# Patient Record
Sex: Female | Born: 1960
Health system: Southern US, Community
[De-identification: ages and names within clinical notes are randomized; demographics above are authoritative.]

## PROBLEM LIST (undated history)

## (undated) DIAGNOSIS — F419 Anxiety disorder, unspecified: Secondary | ICD-10-CM

## (undated) DIAGNOSIS — I5189 Other ill-defined heart diseases: Secondary | ICD-10-CM

## (undated) DIAGNOSIS — R079 Chest pain, unspecified: Secondary | ICD-10-CM

## (undated) DIAGNOSIS — E7800 Pure hypercholesterolemia, unspecified: Secondary | ICD-10-CM

## (undated) DIAGNOSIS — E669 Obesity, unspecified: Secondary | ICD-10-CM

## (undated) DIAGNOSIS — J189 Pneumonia, unspecified organism: Secondary | ICD-10-CM

## (undated) DIAGNOSIS — K219 Gastro-esophageal reflux disease without esophagitis: Secondary | ICD-10-CM

## (undated) DIAGNOSIS — I10 Essential (primary) hypertension: Secondary | ICD-10-CM

## (undated) DIAGNOSIS — M1990 Unspecified osteoarthritis, unspecified site: Secondary | ICD-10-CM

## (undated) DIAGNOSIS — F410 Panic disorder [episodic paroxysmal anxiety] without agoraphobia: Secondary | ICD-10-CM

## (undated) DIAGNOSIS — R87629 Unspecified abnormal cytological findings in specimens from vagina: Secondary | ICD-10-CM

## (undated) DIAGNOSIS — F1410 Cocaine abuse, uncomplicated: Secondary | ICD-10-CM

## (undated) DIAGNOSIS — R7303 Prediabetes: Secondary | ICD-10-CM

## (undated) HISTORY — DX: Obesity, unspecified: E66.9

## (undated) HISTORY — DX: Anxiety disorder, unspecified: F41.9

## (undated) HISTORY — PX: CORONARY ANGIOPLASTY WITH STENT PLACEMENT: SHX49

## (undated) HISTORY — DX: Gastro-esophageal reflux disease without esophagitis: K21.9

## (undated) HISTORY — DX: Unspecified osteoarthritis, unspecified site: M19.90

## (undated) HISTORY — DX: Chest pain, unspecified: R07.9

## (undated) HISTORY — DX: Unspecified abnormal cytological findings in specimens from vagina: R87.629

## (undated) HISTORY — DX: Prediabetes: R73.03

## (undated) HISTORY — DX: Cocaine abuse, uncomplicated: F14.10

## (undated) HISTORY — DX: Other ill-defined heart diseases: I51.89

## (undated) HISTORY — DX: Panic disorder (episodic paroxysmal anxiety): F41.0

---

## 1998-06-05 ENCOUNTER — Ambulatory Visit (HOSPITAL_COMMUNITY): Admission: RE | Admit: 1998-06-05 | Discharge: 1998-06-05 | Payer: Self-pay | Admitting: Family Medicine

## 1998-06-05 ENCOUNTER — Encounter: Payer: Self-pay | Admitting: Family Medicine

## 1999-01-18 ENCOUNTER — Emergency Department (HOSPITAL_COMMUNITY): Admission: EM | Admit: 1999-01-18 | Discharge: 1999-01-18 | Payer: Self-pay | Admitting: Emergency Medicine

## 1999-06-04 ENCOUNTER — Emergency Department (HOSPITAL_COMMUNITY): Admission: EM | Admit: 1999-06-04 | Discharge: 1999-06-04 | Payer: Self-pay | Admitting: *Deleted

## 2000-01-04 ENCOUNTER — Other Ambulatory Visit: Admission: RE | Admit: 2000-01-04 | Discharge: 2000-01-04 | Payer: Self-pay | Admitting: Obstetrics

## 2000-01-21 ENCOUNTER — Encounter: Payer: Self-pay | Admitting: Obstetrics

## 2000-01-21 ENCOUNTER — Encounter: Admission: RE | Admit: 2000-01-21 | Discharge: 2000-01-21 | Payer: Self-pay | Admitting: Obstetrics

## 2001-02-07 ENCOUNTER — Encounter: Payer: Self-pay | Admitting: Obstetrics

## 2001-02-07 ENCOUNTER — Encounter: Admission: RE | Admit: 2001-02-07 | Discharge: 2001-02-07 | Payer: Self-pay | Admitting: Obstetrics

## 2001-03-19 ENCOUNTER — Emergency Department (HOSPITAL_COMMUNITY): Admission: EM | Admit: 2001-03-19 | Discharge: 2001-03-19 | Payer: Self-pay

## 2001-04-21 ENCOUNTER — Encounter: Payer: Self-pay | Admitting: Family Medicine

## 2001-04-21 ENCOUNTER — Encounter: Admission: RE | Admit: 2001-04-21 | Discharge: 2001-04-21 | Payer: Self-pay | Admitting: Family Medicine

## 2002-06-28 ENCOUNTER — Ambulatory Visit (HOSPITAL_COMMUNITY): Admission: RE | Admit: 2002-06-28 | Discharge: 2002-06-28 | Payer: Self-pay | Admitting: Obstetrics

## 2002-06-28 ENCOUNTER — Encounter (INDEPENDENT_AMBULATORY_CARE_PROVIDER_SITE_OTHER): Payer: Self-pay

## 2003-08-07 ENCOUNTER — Ambulatory Visit (HOSPITAL_COMMUNITY): Admission: RE | Admit: 2003-08-07 | Discharge: 2003-08-07 | Payer: Self-pay | Admitting: Obstetrics

## 2005-09-01 ENCOUNTER — Emergency Department (HOSPITAL_COMMUNITY): Admission: EM | Admit: 2005-09-01 | Discharge: 2005-09-01 | Payer: Self-pay | Admitting: Emergency Medicine

## 2005-12-14 ENCOUNTER — Emergency Department (HOSPITAL_COMMUNITY): Admission: EM | Admit: 2005-12-14 | Discharge: 2005-12-14 | Payer: Self-pay | Admitting: *Deleted

## 2007-05-19 ENCOUNTER — Ambulatory Visit (HOSPITAL_COMMUNITY): Admission: RE | Admit: 2007-05-19 | Discharge: 2007-05-19 | Payer: Self-pay | Admitting: Obstetrics

## 2007-06-01 ENCOUNTER — Encounter: Admission: RE | Admit: 2007-06-01 | Discharge: 2007-06-01 | Payer: Self-pay | Admitting: Obstetrics

## 2007-10-30 ENCOUNTER — Emergency Department (HOSPITAL_COMMUNITY): Admission: EM | Admit: 2007-10-30 | Discharge: 2007-10-30 | Payer: Self-pay | Admitting: Emergency Medicine

## 2008-07-02 ENCOUNTER — Encounter: Admission: RE | Admit: 2008-07-02 | Discharge: 2008-07-02 | Payer: Self-pay | Admitting: Obstetrics

## 2008-08-09 ENCOUNTER — Ambulatory Visit (HOSPITAL_COMMUNITY): Admission: RE | Admit: 2008-08-09 | Discharge: 2008-08-09 | Payer: Self-pay | Admitting: Obstetrics

## 2008-09-03 ENCOUNTER — Emergency Department (HOSPITAL_COMMUNITY): Admission: EM | Admit: 2008-09-03 | Discharge: 2008-09-03 | Payer: Self-pay | Admitting: Emergency Medicine

## 2009-01-30 ENCOUNTER — Emergency Department (HOSPITAL_COMMUNITY): Admission: EM | Admit: 2009-01-30 | Discharge: 2009-01-30 | Payer: Self-pay | Admitting: Emergency Medicine

## 2009-06-19 ENCOUNTER — Emergency Department (HOSPITAL_COMMUNITY): Admission: EM | Admit: 2009-06-19 | Discharge: 2009-06-19 | Payer: Self-pay | Admitting: Emergency Medicine

## 2009-07-29 ENCOUNTER — Encounter: Admission: RE | Admit: 2009-07-29 | Discharge: 2009-07-29 | Payer: Self-pay | Admitting: Obstetrics

## 2009-09-12 ENCOUNTER — Emergency Department (HOSPITAL_COMMUNITY): Admission: EM | Admit: 2009-09-12 | Discharge: 2009-09-13 | Payer: Self-pay | Admitting: Emergency Medicine

## 2009-10-14 ENCOUNTER — Emergency Department (HOSPITAL_COMMUNITY): Admission: EM | Admit: 2009-10-14 | Discharge: 2009-10-14 | Payer: Self-pay | Admitting: Emergency Medicine

## 2010-03-24 ENCOUNTER — Emergency Department (HOSPITAL_COMMUNITY): Admission: EM | Admit: 2010-03-24 | Discharge: 2010-03-24 | Payer: Self-pay | Admitting: Family Medicine

## 2010-04-21 ENCOUNTER — Emergency Department (HOSPITAL_COMMUNITY): Admission: EM | Admit: 2010-04-21 | Discharge: 2010-04-21 | Payer: Self-pay | Admitting: Emergency Medicine

## 2010-10-24 ENCOUNTER — Encounter: Payer: Self-pay | Admitting: Family Medicine

## 2010-10-25 ENCOUNTER — Encounter: Payer: Self-pay | Admitting: Obstetrics

## 2010-12-20 LAB — POCT I-STAT, CHEM 8
Calcium, Ion: 1.13 mmol/L (ref 1.12–1.32)
Chloride: 102 mEq/L (ref 96–112)
Creatinine, Ser: 0.9 mg/dL (ref 0.4–1.2)
Hemoglobin: 14.3 g/dL (ref 12.0–15.0)
TCO2: 31 mmol/L (ref 0–100)

## 2011-01-13 LAB — DIFFERENTIAL
Basophils Absolute: 0 10*3/uL (ref 0.0–0.1)
Eosinophils Relative: 2 % (ref 0–5)
Lymphocytes Relative: 27 % (ref 12–46)
Lymphs Abs: 1.8 10*3/uL (ref 0.7–4.0)
Monocytes Absolute: 0.5 10*3/uL (ref 0.1–1.0)
Neutro Abs: 4.3 10*3/uL (ref 1.7–7.7)

## 2011-01-13 LAB — BASIC METABOLIC PANEL
BUN: 10 mg/dL (ref 6–23)
CO2: 31 mEq/L (ref 19–32)
Chloride: 107 mEq/L (ref 96–112)
Creatinine, Ser: 0.9 mg/dL (ref 0.4–1.2)
Glucose, Bld: 93 mg/dL (ref 70–99)
Potassium: 3.4 mEq/L — ABNORMAL LOW (ref 3.5–5.1)
Sodium: 142 mEq/L (ref 135–145)

## 2011-01-13 LAB — CBC
HCT: 38.7 % (ref 36.0–46.0)
Hemoglobin: 13.1 g/dL (ref 12.0–15.0)
MCHC: 33.8 g/dL (ref 30.0–36.0)
RDW: 12.7 % (ref 11.5–15.5)
WBC: 6.8 10*3/uL (ref 4.0–10.5)

## 2011-01-13 LAB — POCT CARDIAC MARKERS
CKMB, poc: <1 ng/mL — ABNORMAL LOW (ref 1.0–8.0)
Troponin i, poc: <0.05 ng/mL (ref 0.00–0.09)

## 2011-02-19 NOTE — Op Note (Signed)
   NAME:  ZERA, MARKWARDT                      ACCOUNT NO.:  [id number]   MEDICAL RECORD NO.:  [id number]                   PATIENT TYPE:  AMB   LOCATION:  SDC                                  FACILITY:  [facility]   PHYSICIAN:  Kathreen Cosier, M.D.           DATE OF BIRTH:  1961/08/28   DATE OF PROCEDURE:  06/28/2002  DATE OF DISCHARGE:                                 OPERATIVE REPORT   PREOPERATIVE DIAGNOSES:  Dysfunctional uterine bleeding.   PROCEDURE:  Diagnostic dilatation and curettage.  Using MAC patient in  lithotomy position.  Perineum and vagina prepped and draped.  Bladder  emptied with straight catheter.  Bimanual examination revealed the uterus to  be symmetrically enlarged.  Negative adnexa.  Weighted speculum placed in  the vagina.  Cervix injected with 8 cc of 1% Xylocaine at 3, 9, and 12  o'clock.  Anterior lip of the cervix grasped with a tenaculum.  The  endocervix was curetted, small amount of tissue obtained.  Endometrial  cavity sounded 10 cm.  The cavity was posterior.  The cervix was dilated  number 29 Pratt and then sharp curettage done until the cavity clean.  Moderate amount of tissue obtained.  There were no myomas felt within the  cavity.  Polyp forceps inserted.  No polyps obtained.  The patient tolerated  procedure well.  Was taken to recovery room in good condition.                                               Kathreen Cosier, M.D.    BAM/MEDQ  D:  06/28/2002  T:  06/28/2002  Job:  [id number]

## 2011-04-22 ENCOUNTER — Emergency Department (HOSPITAL_COMMUNITY)
Admission: EM | Admit: 2011-04-22 | Discharge: 2011-04-22 | Disposition: A | Discharge status: Home / Self Care | Payer: Self-pay | Attending: Emergency Medicine | Admitting: Emergency Medicine

## 2011-04-22 DIAGNOSIS — I10 Essential (primary) hypertension: Secondary | ICD-10-CM | POA: Insufficient documentation

## 2011-04-22 DIAGNOSIS — R51 Headache: Secondary | ICD-10-CM | POA: Insufficient documentation

## 2011-07-09 LAB — POCT I-STAT, CHEM 8
BUN: 11 mg/dL (ref 6–23)
Calcium, Ion: 1.21 mmol/L (ref 1.12–1.32)
Creatinine, Ser: 0.9 mg/dL (ref 0.4–1.2)
Glucose, Bld: 94 mg/dL (ref 70–99)
Sodium: 143 mEq/L (ref 135–145)

## 2011-07-09 LAB — POCT CARDIAC MARKERS

## 2011-08-06 ENCOUNTER — Emergency Department (HOSPITAL_COMMUNITY)
Admission: EM | Admit: 2011-08-06 | Discharge: 2011-08-06 | Disposition: A | Discharge status: Home / Self Care | Payer: No Typology Code available for payment source | Attending: Emergency Medicine | Admitting: Emergency Medicine

## 2011-08-06 DIAGNOSIS — I10 Essential (primary) hypertension: Secondary | ICD-10-CM | POA: Insufficient documentation

## 2011-08-06 DIAGNOSIS — T1490XA Injury, unspecified, initial encounter: Secondary | ICD-10-CM | POA: Insufficient documentation

## 2011-08-06 DIAGNOSIS — R209 Unspecified disturbances of skin sensation: Secondary | ICD-10-CM | POA: Insufficient documentation

## 2011-08-06 DIAGNOSIS — Y92410 Unspecified street and highway as the place of occurrence of the external cause: Secondary | ICD-10-CM | POA: Insufficient documentation

## 2011-12-03 ENCOUNTER — Other Ambulatory Visit: Payer: Self-pay | Admitting: Family Medicine

## 2011-12-03 DIAGNOSIS — Z1231 Encounter for screening mammogram for malignant neoplasm of breast: Secondary | ICD-10-CM

## 2011-12-15 ENCOUNTER — Ambulatory Visit
Admission: RE | Admit: 2011-12-15 | Discharge: 2011-12-15 | Disposition: A | Discharge status: Home / Self Care | Payer: No Typology Code available for payment source | Source: Ambulatory Visit | Attending: Family Medicine | Admitting: Family Medicine

## 2011-12-15 DIAGNOSIS — Z1231 Encounter for screening mammogram for malignant neoplasm of breast: Secondary | ICD-10-CM

## 2012-02-16 ENCOUNTER — Emergency Department (HOSPITAL_COMMUNITY)
Admission: EM | Admit: 2012-02-16 | Discharge: 2012-02-16 | Discharge status: Against Medical Advice | Payer: Medicaid Other | Attending: Emergency Medicine | Admitting: Emergency Medicine

## 2012-02-16 ENCOUNTER — Encounter (HOSPITAL_COMMUNITY): Payer: Self-pay

## 2012-02-16 DIAGNOSIS — R51 Headache: Secondary | ICD-10-CM | POA: Insufficient documentation

## 2012-02-16 HISTORY — DX: Essential (primary) hypertension: I10

## 2012-02-16 HISTORY — DX: Pure hypercholesterolemia, unspecified: E78.00

## 2012-02-16 NOTE — ED Notes (Signed)
Pt states that she started having a headache this evening as she was cooking dinner

## 2012-03-16 ENCOUNTER — Emergency Department (HOSPITAL_COMMUNITY)
Admission: EM | Admit: 2012-03-16 | Discharge: 2012-03-16 | Disposition: A | Discharge status: Home / Self Care | Payer: Medicaid Other

## 2012-03-17 ENCOUNTER — Emergency Department (HOSPITAL_COMMUNITY)
Admission: EM | Admit: 2012-03-17 | Discharge: 2012-03-17 | Disposition: A | Discharge status: Home / Self Care | Payer: Medicaid Other | Source: Home / Self Care | Attending: Emergency Medicine | Admitting: Emergency Medicine

## 2012-03-17 ENCOUNTER — Encounter (HOSPITAL_COMMUNITY): Payer: Self-pay | Admitting: *Deleted

## 2012-03-17 DIAGNOSIS — H7290 Unspecified perforation of tympanic membrane, unspecified ear: Secondary | ICD-10-CM

## 2012-03-17 MED ORDER — HYDROCODONE-ACETAMINOPHEN 5-325 MG PO TABS: 2.0000 | ORAL_TABLET | ORAL | Status: AC | PRN

## 2012-03-17 NOTE — ED Notes (Signed)
PT  States   A  Piece  Of  A  q  Tip  Broke    Off in  Her  Ear  yest  She  Has  Pain

## 2012-03-17 NOTE — ED Notes (Signed)
Pt  Reports       She  Was  At  [facility]  Earlier  And  Thet=y  Tried  To  Irrigate  It  Out  But  Could  Not  Get it  Out

## 2012-03-17 NOTE — ED Notes (Signed)
Piece  Of a  Qt  Tip  Broke  Off  In r  Ear  Yesterday     She  Has  Pain

## 2012-03-17 NOTE — Discharge Instructions (Signed)
Do not get your ear wet. If you decide to take a shower, use a wax or putty earplugs. Do not push this down into your ear. He may take 600 mg of ibuprofen and 1 g of Tylenol up to 4 times a day as needed for pain. Use the Norco only for severe pain cannot take the Norco and Tylenol, so that have Tylenol in them, and too much will hurt your liver. Do not exceed 4 g Tylenol from all sources in the day.

## 2012-03-17 NOTE — ED Provider Notes (Signed)
History     CSN: [id number]  Arrival date & time 03/17/12  1752   First MD Initiated Contact with Patient 03/17/12 1806      Chief Complaint  Patient presents with  . Foreign Body in Ear    (Consider location/radiation/quality/duration/timing/severity/associated sxs/prior treatment) HPI Comments: Patient states that she broke the end of a Q-tip off in her right ear while trying clean it yesterday. She went to another urgent care, have her ear irrigated, and they removed cotton and significant amount of wax. However, patient reports decreased hearing, ear pain since having her ear irrigated. She's concerned that there may be something still lodged in her ear. No otorrhea, nausea, vomiting, fevers. Symptoms are better when she tilts her head to the left. No aggravating factors.   ROS as noted in HPI. All other ROS negative.   Patient is a 51 y.o. female presenting with ear pain. The history is provided by the patient. No language interpreter was used.  Otalgia This is a new problem. The current episode started yesterday. There is pain in the right ear. The problem has not changed since onset.There has been no fever. The pain is moderate. Associated symptoms include hearing loss. Pertinent negatives include no ear discharge, no headaches, no vomiting, no neck pain and no rash.    Past Medical History  Diagnosis Date  . Hypertension   . High cholesterol     History reviewed. No pertinent past surgical history.  History reviewed. No pertinent family history.  History  Substance Use Topics  . Smoking status: Current Some Day Smoker  . Smokeless tobacco: Not on file  . Alcohol Use: No    OB History    Grav Para Term Preterm Abortions TAB SAB Ect Mult Living                  Review of Systems  HENT: Positive for hearing loss and ear pain. Negative for neck pain and ear discharge.   Gastrointestinal: Negative for vomiting.  Skin: Negative for rash.  Neurological: Negative  for headaches.    Allergies  Cyclobenzaprine  Home Medications   Current Outpatient Rx  Name Route Sig Dispense Refill  . AMLODIPINE BESYLATE 10 MG PO TABS Oral Take 10 mg by mouth daily.    Marland Kitchen HYDROCODONE-ACETAMINOPHEN 5-325 MG PO TABS Oral Take 2 tablets by mouth every 4 (four) hours as needed for pain. 20 tablet 0  . ADULT MULTIVITAMIN W/MINERALS CH Oral Take 1 tablet by mouth daily.    Marland Kitchen SIMVASTATIN 20 MG PO TABS Oral Take 20 mg by mouth every evening.      BP 184/74  Pulse 66  Temp 98.3 F (36.8 C) (Oral)  Resp 18  SpO2 100%  Physical Exam  Nursing note and vitals reviewed. Constitutional: She is oriented to person, place, and time. She appears well-developed and well-nourished. No distress.  HENT:  Head: Normocephalic and atraumatic.  Right Ear: External ear and ear canal normal. No drainage. Tympanic membrane is perforated. Decreased hearing is noted.  Left Ear: Hearing, tympanic membrane and ear canal normal.  Eyes: Conjunctivae and EOM are normal.  Neck: Normal range of motion.  Cardiovascular: Normal rate.   Pulmonary/Chest: Effort normal.  Abdominal: She exhibits no distension.  Musculoskeletal: Normal range of motion.  Neurological: She is alert and oriented to person, place, and time.  Skin: Skin is warm and dry.  Psychiatric: She has a normal mood and affect. Her behavior is normal. Judgment and thought content  normal.    ED Course  Procedures (including critical care time)  Labs Reviewed - No data to display No results found.   1. Tympanic membrane perforation     MDM  Patient with acute perforation right TM. No foreign bodies visualized. No otorrhea, signs of infection. Patient does have decreased hearing. Referring patient to Dr. Emeline Darling, ENT on call. Advised her to keep her ear dry until she seen by ENT.  Luiz Blare, MD 03/17/12 2123

## 2012-10-26 ENCOUNTER — Emergency Department (INDEPENDENT_AMBULATORY_CARE_PROVIDER_SITE_OTHER)
Admission: EM | Admit: 2012-10-26 | Discharge: 2012-10-26 | Disposition: A | Discharge status: Home / Self Care | Payer: 59 | Source: Home / Self Care | Attending: Family Medicine | Admitting: Family Medicine

## 2012-10-26 ENCOUNTER — Encounter (HOSPITAL_COMMUNITY): Payer: Self-pay | Admitting: *Deleted

## 2012-10-26 DIAGNOSIS — J329 Chronic sinusitis, unspecified: Secondary | ICD-10-CM

## 2012-10-26 DIAGNOSIS — J40 Bronchitis, not specified as acute or chronic: Secondary | ICD-10-CM

## 2012-10-26 MED ORDER — GUAIFENESIN-CODEINE 100-10 MG/5ML PO SYRP: 5.0000 mL | ORAL_SOLUTION | Freq: Three times a day (TID) | ORAL | Status: DC | PRN

## 2012-10-26 MED ORDER — BENZONATATE 100 MG PO CAPS: 100.0000 mg | ORAL_CAPSULE | Freq: Three times a day (TID) | ORAL | Status: DC

## 2012-10-26 MED ORDER — PREDNISONE 20 MG PO TABS: ORAL_TABLET | ORAL | Status: DC

## 2012-10-26 MED ORDER — AZITHROMYCIN 250 MG PO TABS: ORAL_TABLET | ORAL | Status: DC

## 2012-10-26 MED ORDER — ALBUTEROL SULFATE HFA 108 (90 BASE) MCG/ACT IN AERS: 1.0000 | INHALATION_SPRAY | Freq: Four times a day (QID) | RESPIRATORY_TRACT | Status: DC | PRN

## 2012-10-26 NOTE — ED Notes (Signed)
Pt reports cough, nasal congestion, productive cough with yellow sputum for the past two weeks

## 2012-10-26 NOTE — ED Provider Notes (Signed)
History     CSN: [id number]  Arrival date & time 10/26/12  1339   First MD Initiated Contact with Patient 10/26/12 1400      Chief Complaint  Patient presents with  . URI    (Consider location/radiation/quality/duration/timing/severity/associated sxs/prior treatment) HPI Comments: 52 year old smoker female with history of hypertension. Here complaining of nasal congestion, sinus pressure and productive cough with yellow sputum for 2 weeks. Symptoms associated with coughing spells worse at nighttime and wheezing. Denies current shortness of breath or chest pain. Although reports chest discomfort with coughing. No fever or chills. Appetite is okay. Denies abdominal pain nausea vomiting or diarrhea.   Past Medical History  Diagnosis Date  . Hypertension   . High cholesterol     History reviewed. No pertinent past surgical history.  Family History  Problem Relation Age of Onset  . Family history unknown: Yes    History  Substance Use Topics  . Smoking status: Current Some Day Smoker  . Smokeless tobacco: Not on file  . Alcohol Use: No    OB History    Grav Para Term Preterm Abortions TAB SAB Ect Mult Living                  Review of Systems  Constitutional: Negative for fever, chills, diaphoresis, appetite change and fatigue.  HENT: Positive for congestion, rhinorrhea and sinus pressure. Negative for ear pain, sore throat, facial swelling, trouble swallowing and ear discharge.   Eyes: Negative for discharge.  Respiratory: Positive for cough and wheezing.   Cardiovascular: Negative for chest pain and leg swelling.  Gastrointestinal: Negative for nausea, vomiting, abdominal pain and diarrhea.  Skin: Negative for rash.  Neurological: Negative for dizziness and headaches.  All other systems reviewed and are negative.    Allergies  Cyclobenzaprine  Home Medications   Current Outpatient Rx  Name  Route  Sig  Dispense  Refill  . ALBUTEROL SULFATE HFA 108 (90  BASE) MCG/ACT IN AERS   Inhalation   Inhale 1-2 puffs into the lungs every 6 (six) hours as needed for wheezing.   1 Inhaler   0   . AMLODIPINE BESYLATE 10 MG PO TABS   Oral   Take 10 mg by mouth daily.         . AZITHROMYCIN 250 MG PO TABS      2 tabs by mouth on day one then one tablet daily for 4 more days   6 tablet   0   . BENZONATATE 100 MG PO CAPS   Oral   Take 1 capsule (100 mg total) by mouth every 8 (eight) hours.   21 capsule   0   . GUAIFENESIN-CODEINE 100-10 MG/5ML PO SYRP   Oral   Take 5 mLs by mouth 3 (three) times daily as needed for cough.   120 mL   0   . ADULT MULTIVITAMIN W/MINERALS CH   Oral   Take 1 tablet by mouth daily.         Marland Kitchen PREDNISONE 20 MG PO TABS      2 tabs by mouth daily for 5 days   10 tablet   0   . SIMVASTATIN 20 MG PO TABS   Oral   Take 20 mg by mouth every evening.           BP 152/83  Pulse 77  Temp 98.4 F (36.9 C) (Oral)  Resp 16  SpO2 100%  Physical Exam  Nursing note and vitals reviewed.  Constitutional: She is oriented to person, place, and time. She appears well-developed and well-nourished. No distress.  HENT:  Head: Normocephalic and atraumatic.       Nasal Congestion with erythema and severe swelling of nasal turbinates, thick yellow rhinorrhea. Tenderness to palpation over maxillary sinuses bilaterally. Postnasal drip, pharyngeal erythema no exudates. No uvula deviation. No trismus. TM's with increased vascular markings and some dullness bilaterally no swelling or bulging   Eyes: Conjunctivae normal are normal. Right eye exhibits no discharge. Left eye exhibits no discharge. No scleral icterus.  Neck: Neck supple. No JVD present. No thyromegaly present.  Cardiovascular: Normal rate, regular rhythm, normal heart sounds and intact distal pulses.  Exam reveals no gallop and no friction rub.   No murmur heard. Pulmonary/Chest: Effort normal. No respiratory distress. She has no wheezes. She has no rales.  She exhibits no tenderness.       Sporadic bilateral expiratory rhonchi. Bronchitic cough. No active wheezing. No orthopnea or tachypnea.  Lymphadenopathy:    She has no cervical adenopathy.  Neurological: She is alert and oriented to person, place, and time.  Skin: She is not diaphoretic.    ED Course  Procedures (including critical care time)  Labs Reviewed - No data to display No results found.   1. Bronchitis   2. Sinusitis       MDM  Treated with albuterol, azithromycin, Tessalon Perles, guaifenesin/codeine and prednisone. Supportive care and red flags that should prompt her return to medical attention discussed with patient and provided in writing.         Sharin Grave, MD 10/27/12 1154

## 2013-01-23 ENCOUNTER — Other Ambulatory Visit: Payer: Self-pay

## 2013-01-23 DIAGNOSIS — Z1231 Encounter for screening mammogram for malignant neoplasm of breast: Secondary | ICD-10-CM

## 2013-02-12 ENCOUNTER — Ambulatory Visit: Payer: 59

## 2013-03-07 ENCOUNTER — Ambulatory Visit: Payer: Self-pay | Admitting: Obstetrics

## 2013-05-09 ENCOUNTER — Ambulatory Visit: Payer: Self-pay | Admitting: Obstetrics

## 2013-05-09 DIAGNOSIS — I10 Essential (primary) hypertension: Secondary | ICD-10-CM | POA: Insufficient documentation

## 2013-11-05 ENCOUNTER — Ambulatory Visit
Admission: RE | Admit: 2013-11-05 | Discharge: 2013-11-05 | Disposition: A | Discharge status: Home / Self Care | Payer: Medicaid Other | Source: Ambulatory Visit

## 2013-11-05 DIAGNOSIS — Z1231 Encounter for screening mammogram for malignant neoplasm of breast: Secondary | ICD-10-CM

## 2013-11-07 ENCOUNTER — Other Ambulatory Visit: Payer: Self-pay | Admitting: Family Medicine

## 2013-11-07 DIAGNOSIS — R928 Other abnormal and inconclusive findings on diagnostic imaging of breast: Secondary | ICD-10-CM

## 2013-11-14 ENCOUNTER — Emergency Department (HOSPITAL_COMMUNITY): Payer: Medicaid Other

## 2013-11-14 ENCOUNTER — Encounter (HOSPITAL_COMMUNITY): Payer: Self-pay | Admitting: Emergency Medicine

## 2013-11-14 ENCOUNTER — Emergency Department (HOSPITAL_COMMUNITY)
Admission: EM | Admit: 2013-11-14 | Discharge: 2013-11-14 | Disposition: A | Discharge status: Home / Self Care | Payer: Medicaid Other | Attending: Emergency Medicine | Admitting: Emergency Medicine

## 2013-11-14 DIAGNOSIS — Z87891 Personal history of nicotine dependence: Secondary | ICD-10-CM | POA: Insufficient documentation

## 2013-11-14 DIAGNOSIS — R112 Nausea with vomiting, unspecified: Secondary | ICD-10-CM | POA: Insufficient documentation

## 2013-11-14 DIAGNOSIS — Z79899 Other long term (current) drug therapy: Secondary | ICD-10-CM | POA: Insufficient documentation

## 2013-11-14 DIAGNOSIS — R519 Headache, unspecified: Secondary | ICD-10-CM

## 2013-11-14 DIAGNOSIS — I10 Essential (primary) hypertension: Secondary | ICD-10-CM | POA: Insufficient documentation

## 2013-11-14 DIAGNOSIS — E876 Hypokalemia: Secondary | ICD-10-CM | POA: Insufficient documentation

## 2013-11-14 DIAGNOSIS — N390 Urinary tract infection, site not specified: Secondary | ICD-10-CM | POA: Insufficient documentation

## 2013-11-14 DIAGNOSIS — Z8701 Personal history of pneumonia (recurrent): Secondary | ICD-10-CM | POA: Insufficient documentation

## 2013-11-14 DIAGNOSIS — G9389 Other specified disorders of brain: Secondary | ICD-10-CM | POA: Insufficient documentation

## 2013-11-14 DIAGNOSIS — R51 Headache: Secondary | ICD-10-CM | POA: Insufficient documentation

## 2013-11-14 HISTORY — DX: Pneumonia, unspecified organism: J18.9

## 2013-11-14 LAB — CBC WITH DIFFERENTIAL/PLATELET
BASOS ABS: 0 10*3/uL (ref 0.0–0.1)
BASOS PCT: 1 % (ref 0–1)
Eosinophils Absolute: 0.1 10*3/uL (ref 0.0–0.7)
Eosinophils Relative: 2 % (ref 0–5)
HCT: 44.6 % (ref 36.0–46.0)
HEMOGLOBIN: 14.9 g/dL (ref 12.0–15.0)
Lymphocytes Relative: 29 % (ref 12–46)
Lymphs Abs: 2.4 10*3/uL (ref 0.7–4.0)
MCH: 29.2 pg (ref 26.0–34.0)
MCHC: 33.4 g/dL (ref 30.0–36.0)
MCV: 87.5 fL (ref 78.0–100.0)
MONOS PCT: 9 % (ref 3–12)
Monocytes Absolute: 0.8 10*3/uL (ref 0.1–1.0)
NEUTROS ABS: 5 10*3/uL (ref 1.7–7.7)
Neutrophils Relative %: 59 % (ref 43–77)
PLATELETS: 264 10*3/uL (ref 150–400)
RBC: 5.1 MIL/uL (ref 3.87–5.11)
RDW: 12.8 % (ref 11.5–15.5)
WBC: 8.3 10*3/uL (ref 4.0–10.5)

## 2013-11-14 LAB — COMPREHENSIVE METABOLIC PANEL
ALBUMIN: 4.4 g/dL (ref 3.5–5.2)
ALK PHOS: 73 U/L (ref 39–117)
ALT: 23 U/L (ref 0–35)
AST: 24 U/L (ref 0–37)
BUN: 15 mg/dL (ref 6–23)
CO2: 28 mEq/L (ref 19–32)
Calcium: 9.9 mg/dL (ref 8.4–10.5)
Chloride: 100 mEq/L (ref 96–112)
Creatinine, Ser: 0.92 mg/dL (ref 0.50–1.10)
GFR calc Af Amer: 81 mL/min — ABNORMAL LOW (ref >90)
GFR calc non Af Amer: 70 mL/min — ABNORMAL LOW (ref >90)
Glucose, Bld: 102 mg/dL — ABNORMAL HIGH (ref 70–99)
POTASSIUM: 3.6 meq/L — AB (ref 3.7–5.3)
Sodium: 142 mEq/L (ref 137–147)
Total Bilirubin: 0.4 mg/dL (ref 0.3–1.2)
Total Protein: 8.4 g/dL — ABNORMAL HIGH (ref 6.0–8.3)

## 2013-11-14 LAB — URINALYSIS, ROUTINE W REFLEX MICROSCOPIC
Glucose, UA: NEGATIVE mg/dL
KETONES UR: NEGATIVE mg/dL
Nitrite: NEGATIVE
PH: 5.5 (ref 5.0–8.0)
Protein, ur: NEGATIVE mg/dL
Specific Gravity, Urine: 1.028 (ref 1.005–1.030)
Urobilinogen, UA: 0.2 mg/dL (ref 0.0–1.0)

## 2013-11-14 LAB — URINE MICROSCOPIC-ADD ON

## 2013-11-14 LAB — LIPASE, BLOOD: Lipase: 14 U/L (ref 11–59)

## 2013-11-14 MED ORDER — DEXTROSE 5 % IV SOLN
1.0000 g | Freq: Once | INTRAVENOUS | Status: AC
  Administered 2013-11-14: 1 g via INTRAVENOUS
  Filled 2013-11-14: qty 10

## 2013-11-14 MED ORDER — HYDROMORPHONE HCL PF 1 MG/ML IJ SOLN: 1.0000 mg | Freq: Once | INTRAMUSCULAR | Status: DC

## 2013-11-14 MED ORDER — ONDANSETRON HCL 4 MG/2ML IJ SOLN
4.0000 mg | Freq: Once | INTRAMUSCULAR | Status: AC
  Administered 2013-11-14: 4 mg via INTRAVENOUS
  Filled 2013-11-14: qty 2

## 2013-11-14 MED ORDER — CEPHALEXIN 500 MG PO CAPS: 500.0000 mg | ORAL_CAPSULE | Freq: Three times a day (TID) | ORAL | Status: DC

## 2013-11-14 MED ORDER — SODIUM CHLORIDE 0.9 % IV BOLUS (SEPSIS): 1000.0000 mL | Freq: Once | INTRAVENOUS | Status: DC

## 2013-11-14 MED ORDER — SODIUM CHLORIDE 0.9 % IV BOLUS (SEPSIS)
1000.0000 mL | Freq: Once | INTRAVENOUS | Status: AC
  Administered 2013-11-14: 1000 mL via INTRAVENOUS

## 2013-11-14 MED ORDER — ONDANSETRON 8 MG PO TBDP: 8.0000 mg | ORAL_TABLET | Freq: Three times a day (TID) | ORAL | Status: DC | PRN

## 2013-11-14 MED ORDER — ONDANSETRON HCL 4 MG/2ML IJ SOLN: 4.0000 mg | Freq: Once | INTRAMUSCULAR | Status: DC

## 2013-11-14 NOTE — ED Notes (Signed)
Pt c/o emesis x 3 weeks and a headache starting today.  Pain score 8/10.  Pt reports that [facility] and they want to do a endoscopy or colonoscopy.  Sts she has a follow up w/ them tomorrow.  Sts she came here today, because "I just keep throwing up."  Pt's daughter is concerned that she was diagnosed with PNA "a couple" weeks ago, but she has completed her prescribed medications.  Pt has no respiratory complaints.

## 2013-11-14 NOTE — Progress Notes (Signed)
   CARE MANAGEMENT ED NOTE 11/14/2013  Patient:  Stark BrayMAYNARD,Anatalia R   Account Number:  [id number]  Date Initiated:  11/14/2013  Documentation initiated by:  Radford PaxFERRERO,Mozell Hardacre  Subjective/Objective Assessment:   Patient presents to Ed with headache and vomitting     Subjective/Objective Assessment Detail:     Action/Plan:   Action/Plan Detail:   Anticipated DC Date:  11/14/2013     Status Recommendation to Physician:   Result of Recommendation:    Other ED Services  Consult Working Plan    DC Planning Services  Other  PCP issues    Choice offered to / List presented to:            Status of service:  Completed, signed off  ED Comments:   ED Comments Detail:  Patient confirms her pcp is Dr. Lowell GuitarPowell at  [facility], but she is looking for a new physician.  [facility] of [facility] who accept Medicaid [place] in [place].  Patient thankful for resources.  No further EDCM needs at this time.

## 2013-11-14 NOTE — ED Notes (Signed)
Patient transported to X-ray 

## 2013-11-14 NOTE — Discharge Instructions (Signed)

## 2013-11-14 NOTE — ED Provider Notes (Signed)
CSN: [id number]     Arrival date & time 11/14/13  1609 History   First MD Initiated Contact with Patient 11/14/13 1938     Chief Complaint  Patient presents with  . Emesis  . Headache     (Consider location/radiation/quality/duration/timing/severity/associated sxs/prior Treatment) HPI This is a 53 year old female comes in today complaining of headache and vomiting. She states that she was seeing her primary care physician's office 2 weeks ago and diagnosed with pneumonia. She was placed on antibiotics which she believes were Zithromax and a course of antibiotics. She improved somewhat. She then again began having vomiting and was seen again in her primary care doctor's office a week later at that time an outpatient ultrasound ordered. She states that she went back to get the results of the ultrasound has told her she could not be seen because she had missed an appointment. She states she did not get the results of the ultrasound. She states that she is continued to have cough and vomiting each day for the past week. States she has vomited about 4 times today. She states that she is concerned because she has a mild headache and she thinks that her blood pressure may be causing this. She has been unable to keep her antihypertensive medications down. She states her blood pressure has been running somewhat high secondary to this. She is not a smoker and denies alcohol use. She has had some subjective fever but does not think she has had any during the past week. She has had some cough but it has not been productive. She hasn't vomited up her food on a regular basis. She states she is keeping liquids down. Past Medical History  Diagnosis Date  . Hypertension   . High cholesterol   . PNA (pneumonia)    Past Surgical History  Procedure Laterality Date  . Cesarean section     History reviewed. No pertinent family history. History  Substance Use Topics  . Smoking status: Former Games developermoker  . Smokeless  tobacco: Not on file  . Alcohol Use: No   OB History   Grav Para Term Preterm Abortions TAB SAB Ect Mult Living                 Review of Systems  All other systems reviewed and are negative.      Allergies  Cyclobenzaprine  Home Medications   Current Outpatient Rx  Name  Route  Sig  Dispense  Refill  . albuterol (PROVENTIL HFA;VENTOLIN HFA) 108 (90 BASE) MCG/ACT inhaler   Inhalation   Inhale 1-2 puffs into the lungs every 6 (six) hours as needed for wheezing.   1 Inhaler   0   . hydrOXYzine (ATARAX/VISTARIL) 25 MG tablet   Oral   Take 25 mg by mouth 2 (two) times daily as needed for anxiety.          Marland Kitchen. lisinopril (PRINIVIL,ZESTRIL) 40 MG tablet   Oral   Take 20-40 mg by mouth daily.          Marland Kitchen. omeprazole (PRILOSEC) 20 MG capsule   Oral   Take 20 mg by mouth 2 (two) times daily before a meal.          . sertraline (ZOLOFT) 100 MG tablet   Oral   Take 100 mg by mouth at bedtime.           BP 181/88  Pulse 78  Temp(Src) 98.6 F (37 C) (Oral)  Resp 20  Ht 5'  8" (1.727 m)  Wt 176 lb (79.833 kg)  BMI 26.77 kg/m2  SpO2 98% Physical Exam  Nursing note and vitals reviewed. Constitutional: She is oriented to person, place, and time. She appears well-developed and well-nourished.  HENT:  Head: Normocephalic and atraumatic.  Right Ear: Tympanic membrane and external ear normal.  Left Ear: Tympanic membrane and external ear normal.  Nose: Nose normal. Right sinus exhibits no maxillary sinus tenderness and no frontal sinus tenderness. Left sinus exhibits no maxillary sinus tenderness and no frontal sinus tenderness.  Mouth/Throat: Oropharynx is clear and moist.  Eyes: Conjunctivae and EOM are normal. Pupils are equal, round, and reactive to light. Right eye exhibits no nystagmus. Left eye exhibits no nystagmus.  Neck: Normal range of motion. Neck supple. No JVD present. No tracheal deviation present. No thyromegaly present.  Cardiovascular: Normal rate,  regular rhythm, normal heart sounds and intact distal pulses.   Pulmonary/Chest: Effort normal and breath sounds normal. No respiratory distress. She has no wheezes. She exhibits no tenderness.  Abdominal: Soft. Bowel sounds are normal. She exhibits no distension and no mass. There is no tenderness. There is no guarding.  Musculoskeletal: Normal range of motion. She exhibits no edema and no tenderness.  Lymphadenopathy:    She has no cervical adenopathy.  Neurological: She is alert and oriented to person, place, and time. She has normal strength and normal reflexes. No cranial nerve deficit or sensory deficit. She displays a negative Romberg sign. Gait normal. GCS eye subscore is 4. GCS verbal subscore is 5. GCS motor subscore is 6.  Reflex Scores:      Tricep reflexes are 2+ on the right side and 2+ on the left side.      Bicep reflexes are 2+ on the right side and 2+ on the left side.      Brachioradialis reflexes are 2+ on the right side and 2+ on the left side.      Patellar reflexes are 2+ on the right side and 2+ on the left side.      Achilles reflexes are 2+ on the right side and 2+ on the left side. Strength is 5/5 bilateral elbow flexor/extensors, wrist extension/flexion, intrinsic hand strength equal Bilateral hip flexion/extension 5/5, knee flexion/extension 5/5, ankle 5/5 flexion extension    Skin: Skin is warm and dry. No rash noted.  Psychiatric: She has a normal mood and affect. Her behavior is normal. Judgment and thought content normal.    ED Course  Procedures (including critical care time) Labs Review Labs Reviewed  COMPREHENSIVE METABOLIC PANEL - Abnormal; Notable for the following:    Potassium 3.6 (*)    Glucose, Bld 102 (*)    Total Protein 8.4 (*)    GFR calc non Af Amer 70 (*)    GFR calc Af Amer 81 (*)    All other components within normal limits  URINALYSIS, ROUTINE W REFLEX MICROSCOPIC - Abnormal; Notable for the following:    APPearance CLOUDY (*)     Hgb urine dipstick MODERATE (*)    Bilirubin Urine SMALL (*)    Leukocytes, UA MODERATE (*)    All other components within normal limits  URINE MICROSCOPIC-ADD ON - Abnormal; Notable for the following:    Squamous Epithelial / LPF MANY (*)    All other components within normal limits  CBC WITH DIFFERENTIAL  LIPASE, BLOOD   Imaging Review Dg Chest 2 View  11/14/2013   CLINICAL DATA:  Cough and vomiting  EXAM: CHEST  2 VIEW  COMPARISON:  DG CHEST 2 VIEW dated 01/30/2009  FINDINGS: Significant thoracolumbar scoliosis. Heart size and vascular pattern normal. Lungs clear. No effusions.  IMPRESSION: No acute findings   Electronically Signed   By: Esperanza Heir M.D.   On: 11/14/2013 20:55   Ct Head Wo Contrast  11/14/2013   CLINICAL DATA:  Headache with hypertension  EXAM: CT HEAD WITHOUT CONTRAST  TECHNIQUE: Contiguous axial images were obtained from the base of the skull through the vertex without intravenous contrast.  COMPARISON:  None.  FINDINGS: Minimal atrophy. No hydrocephalus. No abnormal attenuation to suggest hemorrhage, extra-axial fluid, or mass. Calcification of the falx anteriorly, with heavy calcification measuring 18 x 7 mm. There is also heavy calcification of the falx posteriorly to the left of midline measuring 8 x 13 mm. At the vertex on the right there is an extra-axial 7 mm calcified mass. Calvarium is intact. No significant inflammatory change in the sinuses.  IMPRESSION: No acute findings. Intracranial calcifications noted and likely not of acute clinical significance. These could represent meningiomas.   Electronically Signed   By: Esperanza Heir M.D.   On: 11/14/2013 21:01     MDM   Final diagnoses:  None   1 headache patient with CT does not show any evidence of bleeding. She has some intracranial calcifications and will be referred to neurology for followup. I discussed this with the patient. 2 hypertension patient has likely been unable to keep down her  antihypertensives on a regular basis it is somewhat hypertensive secondary to this. I do not think that this is likely consistent with causing her headache however. She is going to be given antiemetics that she can keep down her antihypertensives and will be advised to have close followup. 3 mild hypokalemia again this is likely secondary to her vomiting. I discussed the types of foods that she should take in secondary to this. 3 urinary tract infection patient given Rocephin here and will be placed on Keflex as an outpatient. Additionally she had a chest x-Jeslyn Amsler done that does not show any evidence of infiltrate here.    Hilario Quarry, MD 11/14/13 2222

## 2013-11-14 NOTE — ED Notes (Signed)
Pt states she's here b/c she's been vomiting x 3 days, denies diarrhea or fever, states today she "felt cold" when asked if has felt chills, pt states she also came d/t high blood pressure, states has a hx and has been taking medication as prescribed.

## 2013-11-14 NOTE — ED Notes (Signed)
Patient transported to CT 

## 2013-11-20 ENCOUNTER — Other Ambulatory Visit: Payer: 59

## 2013-11-26 ENCOUNTER — Other Ambulatory Visit: Payer: 59

## 2013-12-07 ENCOUNTER — Other Ambulatory Visit: Payer: 59

## 2013-12-14 ENCOUNTER — Other Ambulatory Visit: Payer: 59

## 2014-01-18 ENCOUNTER — Other Ambulatory Visit: Payer: Self-pay | Admitting: Family Medicine

## 2014-01-18 DIAGNOSIS — R928 Other abnormal and inconclusive findings on diagnostic imaging of breast: Secondary | ICD-10-CM

## 2014-01-28 ENCOUNTER — Other Ambulatory Visit: Payer: 59

## 2014-02-21 ENCOUNTER — Telehealth: Payer: Self-pay

## 2014-02-21 NOTE — Telephone Encounter (Signed)
Pt called with her neighbor, Amil AmenVeronica Lamke, cause she does not understand English well and pt gave permission for Suzette BattiestVeronica to speak concerning her questions.  Pt stated that she has had surgery and that the surgery is not helping her problem she is still urinating a lot and have to wear a diaper.  Looking into pt's chart pt had a hysterectomy in Feburary.  I explained that the surgery she had in February is not related to her bladder.  Looking further into her chart pt was referred to [facility].  I informed pt that she is to go there for the urinating issue. Suzette BattiestVeronica stated that they had called the wrong office that they needed to contact [facility] for there questions.  I informed her yes. They had no further questions.

## 2015-08-12 ENCOUNTER — Emergency Department (HOSPITAL_COMMUNITY): Payer: Self-pay

## 2015-08-12 ENCOUNTER — Observation Stay (HOSPITAL_COMMUNITY)
Admission: EM | Admit: 2015-08-12 | Discharge: 2015-08-13 | Disposition: A | Discharge status: Home / Self Care | Payer: Self-pay | Attending: Internal Medicine | Admitting: Internal Medicine

## 2015-08-12 ENCOUNTER — Encounter (HOSPITAL_COMMUNITY): Payer: Self-pay | Admitting: Emergency Medicine

## 2015-08-12 DIAGNOSIS — Z9114 Patient's other noncompliance with medication regimen: Secondary | ICD-10-CM | POA: Insufficient documentation

## 2015-08-12 DIAGNOSIS — I10 Essential (primary) hypertension: Secondary | ICD-10-CM | POA: Insufficient documentation

## 2015-08-12 DIAGNOSIS — E7800 Pure hypercholesterolemia, unspecified: Secondary | ICD-10-CM | POA: Insufficient documentation

## 2015-08-12 DIAGNOSIS — Z791 Long term (current) use of non-steroidal anti-inflammatories (NSAID): Secondary | ICD-10-CM | POA: Insufficient documentation

## 2015-08-12 DIAGNOSIS — Z79899 Other long term (current) drug therapy: Secondary | ICD-10-CM | POA: Insufficient documentation

## 2015-08-12 DIAGNOSIS — I160 Hypertensive urgency: Secondary | ICD-10-CM | POA: Insufficient documentation

## 2015-08-12 DIAGNOSIS — Z87891 Personal history of nicotine dependence: Secondary | ICD-10-CM | POA: Insufficient documentation

## 2015-08-12 DIAGNOSIS — R079 Chest pain, unspecified: Principal | ICD-10-CM | POA: Insufficient documentation

## 2015-08-12 LAB — CBC WITH DIFFERENTIAL/PLATELET
BASOS ABS: 0 10*3/uL (ref 0.0–0.1)
BASOS PCT: 0 %
Eosinophils Absolute: 0.2 10*3/uL (ref 0.0–0.7)
Eosinophils Relative: 2 %
HEMATOCRIT: 41.1 % (ref 36.0–46.0)
HEMOGLOBIN: 13.7 g/dL (ref 12.0–15.0)
Lymphocytes Relative: 32 %
Lymphs Abs: 2.5 10*3/uL (ref 0.7–4.0)
MCH: 29.1 pg (ref 26.0–34.0)
MCHC: 33.3 g/dL (ref 30.0–36.0)
MCV: 87.4 fL (ref 78.0–100.0)
MONOS PCT: 9 %
Monocytes Absolute: 0.7 10*3/uL (ref 0.1–1.0)
NEUTROS ABS: 4.5 10*3/uL (ref 1.7–7.7)
NEUTROS PCT: 57 %
Platelets: 264 10*3/uL (ref 150–400)
RBC: 4.7 MIL/uL (ref 3.87–5.11)
RDW: 12.7 % (ref 11.5–15.5)
WBC: 7.9 10*3/uL (ref 4.0–10.5)

## 2015-08-12 LAB — BASIC METABOLIC PANEL
ANION GAP: 7 (ref 5–15)
BUN: 18 mg/dL (ref 6–20)
CALCIUM: 9.4 mg/dL (ref 8.9–10.3)
CO2: 28 mmol/L (ref 22–32)
CREATININE: 1 mg/dL (ref 0.44–1.00)
Chloride: 106 mmol/L (ref 101–111)
Glucose, Bld: 124 mg/dL — ABNORMAL HIGH (ref 65–99)
Potassium: 3.2 mmol/L — ABNORMAL LOW (ref 3.5–5.1)
SODIUM: 141 mmol/L (ref 135–145)

## 2015-08-12 LAB — TROPONIN I

## 2015-08-12 MED ORDER — HYDRALAZINE HCL 20 MG/ML IJ SOLN
5.0000 mg | Freq: Once | INTRAMUSCULAR | Status: AC
  Administered 2015-08-12: 5 mg via INTRAVENOUS
  Filled 2015-08-12: qty 1

## 2015-08-12 MED ORDER — METOCLOPRAMIDE HCL 5 MG/ML IJ SOLN
10.0000 mg | Freq: Once | INTRAMUSCULAR | Status: AC
  Administered 2015-08-12: 10 mg via INTRAVENOUS
  Filled 2015-08-12: qty 2

## 2015-08-12 MED ORDER — FENTANYL CITRATE (PF) 100 MCG/2ML IJ SOLN
50.0000 ug | Freq: Once | INTRAMUSCULAR | Status: AC
  Administered 2015-08-12: 50 ug via INTRAVENOUS
  Filled 2015-08-12: qty 2

## 2015-08-12 MED ORDER — POTASSIUM CHLORIDE CRYS ER 20 MEQ PO TBCR
40.0000 meq | EXTENDED_RELEASE_TABLET | Freq: Once | ORAL | Status: AC
  Administered 2015-08-12: 40 meq via ORAL
  Filled 2015-08-12: qty 2

## 2015-08-12 MED ORDER — LISINOPRIL 5 MG PO TABS
5.0000 mg | ORAL_TABLET | Freq: Every day | ORAL | Status: DC
  Administered 2015-08-13: 5 mg via ORAL
  Filled 2015-08-12: qty 1

## 2015-08-12 MED ORDER — DIPHENHYDRAMINE HCL 50 MG/ML IJ SOLN
25.0000 mg | Freq: Once | INTRAMUSCULAR | Status: AC
  Administered 2015-08-12: 25 mg via INTRAVENOUS
  Filled 2015-08-12: qty 1

## 2015-08-12 MED ORDER — FENTANYL CITRATE (PF) 100 MCG/2ML IJ SOLN
50.0000 ug | Freq: Once | INTRAMUSCULAR | Status: DC
  Filled 2015-08-12: qty 2

## 2015-08-12 MED ORDER — ENOXAPARIN SODIUM 40 MG/0.4ML ~~LOC~~ SOLN
40.0000 mg | Freq: Every day | SUBCUTANEOUS | Status: DC
  Administered 2015-08-13: 40 mg via SUBCUTANEOUS
  Filled 2015-08-12: qty 0.4

## 2015-08-12 NOTE — ED Notes (Signed)
Pt has not been diagnosed with HTN but today BP was high and yesterday. Pt also c/o headache.

## 2015-08-12 NOTE — H&P (Signed)
History and Physical  Shelby Rojas RUE:[id number] DOB: Oct 24, 1960 DOA: 08/12/2015  PCP: Juliann Pares, DO   Chief Complaint: elevated BP  History of Present Illness:  - patient with a history of HTN came here because her BP has been high > 180 for the past 2 weeks. She has h/o HTN but does not take any meds ( for the past 2 years ) as she does not have insurance.  - She said she has been getting chest pain for the past two years especially with exertion although she gets it sometimes when she is sitting. The pain is across her chest. The pain seems to correlate with her anxiety attacks as she has other symptoms of feeling anxious and sweating.  - she has no other complaints. She has no chest pain now. ER requested consult for ACS rule out.  - She smoked 1 pack per 2 weeks for 5 years. No family history of CAD. She never had a stress test/Echo/ MI/CVA.  Review of Systems:  CONSTITUTIONAL:  No night sweats.  No fatigue, malaise, lethargy.  No fever or chills. Eyes:  No visual changes.  No eye pain.  No eye discharge.   ENT:    No epistaxis.  No sinus pain.  No sore throat.  No ear pain.  No congestion. RESPIRATORY:  No cough.  No wheeze.  No hemoptysis.  No shortness of breath. CARDIOVASCULAR:  No chest pains.  No palpitations. GASTROINTESTINAL:  No abdominal pain.  No nausea or vomiting.  No diarrhea or constipation.  No hematemesis.  No hematochezia.  No melena. GENITOURINARY:  No urgency.  No frequency.  No dysuria.  No hematuria.  No obstructive symptoms.  No discharge.  No pain.  No significant abnormal bleeding. MUSCULOSKELETAL:  No musculoskeletal pain.  No joint swelling.  No arthritis. NEUROLOGICAL:  No confusion.  No weakness. No headache. No seizure. PSYCHIATRIC:  No depression. No anxiety. No suicidal ideation. SKIN:  No rashes.  No lesions.  No wounds. ENDOCRINE:  No unexplained weight loss.  No polydipsia.  No polyuria.  No polyphagia. HEMATOLOGIC:  No anemia.   No purpura.  No petechiae.  No bleeding.  ALLERGIC AND IMMUNOLOGIC:  No pruritus.  No swelling Other:  Past Medical and Surgical History:   Past Medical History  Diagnosis Date  . Hypertension   . High cholesterol   . PNA (pneumonia)    Past Surgical History  Procedure Laterality Date  . Cesarean section      Social History:   reports that she has quit smoking. She does not have any smokeless tobacco history on file. She reports that she does not drink alcohol or use illicit drugs.   Allergies  Allergen Reactions  . Cyclobenzaprine Anaphylaxis    Pt  States  She  Is  Allergic  To  Muscle  relaxant    FH:  No CAD. No CVA.   Prior to Admission medications   Medication Sig Start Date End Date Taking? Authorizing Provider  albuterol (PROVENTIL HFA;VENTOLIN HFA) 108 (90 BASE) MCG/ACT inhaler Inhale 1-2 puffs into the lungs every 6 (six) hours as needed for wheezing. 10/26/12  Yes Adlih Moreno-Coll, MD  Ascorbic Acid (VITAMIN C PO) Take 1 tablet by mouth daily.   Yes Historical Provider, MD  ibuprofen (ADVIL,MOTRIN) 200 MG tablet Take 200 mg by mouth every 6 (six) hours as needed for moderate pain.   Yes Historical Provider, MD  cephALEXin (KEFLEX) 500 MG capsule Take 1 capsule (500 mg total)  by mouth 3 (three) times daily. Patient not taking: Reported on 08/12/2015 11/14/13   Margarita Grizzleanielle Ray, MD  lisinopril (PRINIVIL,ZESTRIL) 40 MG tablet Take 20-40 mg by mouth daily.  10/15/13 10/15/14  Historical Provider, MD  omeprazole (PRILOSEC) 20 MG capsule Take 20 mg by mouth 2 (two) times daily before a meal.  11/13/13 11/13/14  Historical Provider, MD  ondansetron (ZOFRAN ODT) 8 MG disintegrating tablet Take 1 tablet (8 mg total) by mouth every 8 (eight) hours as needed for nausea or vomiting. Patient not taking: Reported on 08/12/2015 11/14/13   Margarita Grizzleanielle Ray, MD    Physical Exam: BP 173/87 mmHg  Pulse 88  Temp(Src) 98.2 F (36.8 C) (Oral)  Resp 16  SpO2 100%  GENERAL : Well developed,  well nourished, alert and cooperative, and appears to be in no acute distress. HEAD: normocephalic. EYES: PERRL, EOMI. EARS:  hearing grossly intact. NOSE: No nasal discharge. THROAT: Oral cavity and pharynx normal. NECK: Neck supple CARDIAC: Normal S1 and S2. No S3, S4 or murmurs. Rhythm is regular. There is no peripheral edema,. LUNGS: Clear to auscultation and percussion without rales, rhonchi, wheezing or diminished breath sounds. ABDOMEN: Positive bowel sounds. Soft, nondistended, nontender. No guarding or rebound. No masses. NEUROLOGICAL: The mental examination revealed the patient was oriented to person, place, and time.CN II-XII intact. Strength and sensation symmetric and intact throughout.  SKIN: Skin normal color, texture and turgor with no lesions or eruptions. PSYCHIATRIC:  The patient was able to demonstrate good judgement and reason, without hallucinations, abnormal affect or abnormal behaviors during the examination. Patient is not suicidal.          Labs on Admission:  Reviewed.   Radiological Exams on Admission: Dg Chest 2 View  08/12/2015  CLINICAL DATA:  Hypertension and shortness of Breath tonight. EXAM: CHEST  2 VIEW COMPARISON:  11/14/2013 FINDINGS: The cardiac silhouette, mediastinal and hilar contours are within normal limits and stable. The lungs are clear. 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 Stable thoracic scoliosis. IMPRESSION: No acute cardiopulmonary findings. Electronically Signed   By: Rudie MeyerP.  Gallerani M.D.   On: 08/12/2015 18:25    EKG:  Independently reviewed. NRS  Assessment/Plan HTN: Will start lisinopril 5 mg once daily.  She had IV hydralazine in the ER  Chest pain:  Chronic , atypical , possibly due to anxiety  Will r/u ACS with serial trops/EKG May need stress test as outpatient Will check Echo  DVT prophylaxis: Martin enoxaparin.  Code Status: Full      Eston EstersAhmad Vianey Caniglia M.D [facility]

## 2015-08-12 NOTE — ED Provider Notes (Signed)
CSN: [id number]     Arrival date & time 08/12/15  1638 History   First MD Initiated Contact with Patient 08/12/15 1745     Chief Complaint  Patient presents with  . Headache  . Hypertension     (Consider location/radiation/quality/duration/timing/severity/associated sxs/prior Treatment) HPI Comments: Patient here complaining of increased blood pressure which was recognized when she went to donate plasma. Does have a history of hypertension but has been noncompliant for the past 2 months. Has had a mild left-sided headache without vomiting or neurological changes. Patient denies any focal weakness. She endorses intermittent substernal chest pain with associated diaphoresis for several months which has been increasing recently. Denies any syncope or near-syncope. No cough or congestion. Does note some dyspnea on exertion denies any lower extremity edema  Patient is a [age] y.o. female presenting with headaches and hypertension. The history is provided by the patient and a relative.  Headache Hypertension Associated symptoms include headaches.    Past Medical History  Diagnosis Date  . Hypertension   . High cholesterol   . PNA (pneumonia)    Past Surgical History  Procedure Laterality Date  . Cesarean section     History reviewed. No pertinent family history. Social History  Substance Use Topics  . Smoking status: Former Games developermoker  . Smokeless tobacco: None  . Alcohol Use: No   OB History    No data available     Review of Systems  Neurological: Positive for headaches.  All other systems reviewed and are negative.     Allergies  Cyclobenzaprine  Home Medications   Prior to Admission medications   Medication Sig Start Date End Date Taking? Authorizing Provider  albuterol (PROVENTIL HFA;VENTOLIN HFA) 108 (90 BASE) MCG/ACT inhaler Inhale 1-2 puffs into the lungs every 6 (six) hours as needed for wheezing. 10/26/12  Yes Adlih Moreno-Coll, MD  Ascorbic Acid (VITAMIN C PO) Take  1 tablet by mouth daily.   Yes Historical Provider, MD  ibuprofen (ADVIL,MOTRIN) 200 MG tablet Take 200 mg by mouth every 6 (six) hours as needed for moderate pain.   Yes Historical Provider, MD  cephALEXin (KEFLEX) 500 MG capsule Take 1 capsule (500 mg total) by mouth 3 (three) times daily. Patient not taking: Reported on 08/12/2015 11/14/13   Margarita Grizzleanielle Ray, MD  lisinopril (PRINIVIL,ZESTRIL) 40 MG tablet Take 20-40 mg by mouth daily.  10/15/13 10/15/14  Historical Provider, MD  omeprazole (PRILOSEC) 20 MG capsule Take 20 mg by mouth 2 (two) times daily before a meal.  11/13/13 11/13/14  Historical Provider, MD  ondansetron (ZOFRAN ODT) 8 MG disintegrating tablet Take 1 tablet (8 mg total) by mouth every 8 (eight) hours as needed for nausea or vomiting. Patient not taking: Reported on 08/12/2015 11/14/13   Margarita Grizzleanielle Ray, MD   BP 211/101 mmHg  Pulse 74  Temp(Src) 98.2 F (36.8 C) (Oral)  Resp 20  SpO2 100% Physical Exam  Constitutional: She is oriented to person, place, and time. She appears well-developed and well-nourished.  Non-toxic appearance. No distress.  HENT:  Head: Normocephalic and atraumatic.  Eyes: Conjunctivae, EOM and lids are normal. Pupils are equal, round, and reactive to light.  Neck: Normal range of motion. Neck supple. No tracheal deviation present. No thyroid mass present.  Cardiovascular: Normal rate, regular rhythm and normal heart sounds.  Exam reveals no gallop.   No murmur heard. Pulmonary/Chest: Effort normal and breath sounds normal. No stridor. No respiratory distress. She has no decreased breath sounds. She has no wheezes. She  has no rhonchi. She has no rales.  Abdominal: Soft. Normal appearance and bowel sounds are normal. She exhibits no distension. There is no tenderness. There is no rebound and no CVA tenderness.  Musculoskeletal: Normal range of motion. She exhibits no edema or tenderness.  Neurological: She is alert and oriented to person, place, and time. She has  normal strength. No cranial nerve deficit or sensory deficit. GCS eye subscore is 4. GCS verbal subscore is 5. GCS motor subscore is 6.  Skin: Skin is warm and dry. No abrasion and no rash noted.  Psychiatric: She has a normal mood and affect. Her speech is normal and behavior is normal.  Nursing note and vitals reviewed.   ED Course  Procedures (including critical care time) Labs Review Labs Reviewed  BASIC METABOLIC PANEL  CBC WITH DIFFERENTIAL/PLATELET  TROPONIN I    Imaging Review No results found. I have personally reviewed and evaluated these images and lab results as part of my medical decision-making.   EKG Interpretation   Date/Time:  Tuesday August 12 2015 18:13:35 EST Ventricular Rate:  68 PR Interval:  149 QRS Duration: 96 QT Interval:  437 QTC Calculation: 465 R Axis:   43 Text Interpretation:  Sinus rhythm Borderline T wave abnormalities No  significant change since last tracing Confirmed by Zadkiel Dragan  MD, Rosemaria Inabinet  ([id number]) on 08/12/2015 6:55:06 PM      MDM   Final diagnoses:  Chest pain    Patient given 2 doses of IV hydralazine here and blood pressure improved. Also given treatment for her headache with fentanyl and Reglan. Do not think that she has intracerebral hemorrhage. Blood pressure has improved with it her headache got better. Repeat neurological exam remains stable. Will be admitted to the hospitalist for chest pain rule out   CRITICAL CARE Performed by: Toy Baker Total critical care time: 50 minutes Critical care time was exclusive of separately billable procedures and treating other patients. Critical care was necessary to treat or prevent imminent or life-threatening deterioration. Critical care was time spent personally by me on the following activities: development of treatment plan with patient and/or surrogate as well as nursing, discussions with consultants, evaluation of patient's response to treatment, examination of patient,  obtaining history from patient or surrogate, ordering and performing treatments and interventions, ordering and review of laboratory studies, ordering and review of radiographic studies, pulse oximetry and re-evaluation of patient's condition.    Lorre Nick, MD 08/12/15 [phone]

## 2015-08-12 NOTE — ED Notes (Signed)
Pt c/o nausea. MD notified.

## 2015-08-13 ENCOUNTER — Observation Stay (HOSPITAL_BASED_OUTPATIENT_CLINIC_OR_DEPARTMENT_OTHER): Payer: Self-pay

## 2015-08-13 ENCOUNTER — Encounter (HOSPITAL_COMMUNITY): Payer: Self-pay | Admitting: Emergency Medicine

## 2015-08-13 DIAGNOSIS — R079 Chest pain, unspecified: Secondary | ICD-10-CM

## 2015-08-13 LAB — BASIC METABOLIC PANEL
Anion gap: 6 (ref 5–15)
BUN: 13 mg/dL (ref 6–20)
CO2: 27 mmol/L (ref 22–32)
CREATININE: 0.9 mg/dL (ref 0.44–1.00)
Calcium: 9.5 mg/dL (ref 8.9–10.3)
Chloride: 107 mmol/L (ref 101–111)
GFR calc Af Amer: >60 mL/min (ref >60)
GLUCOSE: 111 mg/dL — AB (ref 65–99)
POTASSIUM: 3.8 mmol/L (ref 3.5–5.1)
SODIUM: 140 mmol/L (ref 135–145)

## 2015-08-13 LAB — TROPONIN I
Troponin I: <0.03 ng/mL (ref <0.031)
Troponin I: <0.03 ng/mL (ref <0.031)

## 2015-08-13 MED ORDER — LISINOPRIL 5 MG PO TABS: 5.0000 mg | ORAL_TABLET | Freq: Every day | ORAL | Status: DC

## 2015-08-13 NOTE — Care Management Note (Signed)
Case Management Note  Patient Details  Name: Shelby Rojas MRN: [id number] Date of Birth: 07/02/1961  Subjective/Objective:  54 y/o f admitted w/Chest pain. From home.                  Action/Plan:d/c plan home. No needs or orders.   Expected Discharge Date:                  Expected Discharge Plan:  Home/Self Care  In-House Referral:     Discharge planning Services  CM Consult  Post Acute Care Choice:    Choice offered to:     DME Arranged:    DME Agency:     HH Arranged:    HH Agency:     Status of Service:  Completed, signed off  Medicare Important Message Given:    Date Medicare IM Given:    Medicare IM give by:    Date Additional Medicare IM Given:    Additional Medicare Important Message give by:     If discussed at Long Length of Stay Meetings, dates discussed:    Additional Comments:  Lanier ClamMahabir, Kenta Laster, RN 08/13/2015, 11:34 AM

## 2015-08-13 NOTE — Discharge Summary (Signed)
Physician Discharge Summary  Shelby Rojas YQM:[id number] DOB: December 12, 1960 DOA: 08/12/2015  PCP: Juliann Pares, DO  Admit date: 08/12/2015 Discharge date: 08/13/2015  Time spent: 40 minutes  Recommendations for Outpatient Follow-up:  1. Follow-up with primary care physician within one week  Discharge Diagnoses:  Principal Problem:   Chest pain  hypertensive urgency  Discharge Condition: Stable  Diet recommendation: Heart healthy  Filed Weights   08/12/15 2357  Weight: 79.833 kg (176 lb)    History of present illness:  - patient with a history of HTN came here because her BP has been high > 180 for the past 2 weeks. She has h/o HTN but does not take any meds ( for the past 2 years ) as she does not have insurance.  - She said she has been getting chest pain for the past two years especially with exertion although she gets it sometimes when she is sitting. The pain is across her chest. The pain seems to correlate with her anxiety attacks as she has other symptoms of feeling anxious and sweating.  - she has no other complaints. She has no chest pain now. ER requested consult for ACS rule out.  - She smoked 1 pack per 2 weeks for 5 years. No family history of CAD. She never had a stress test/Echo/ MI/CVA.  Hospital Course:   Hypertensive urgency Patient diagnosed with high blood pressure for the past 2 years, supposed to be on lisinopril, she is not able to take it. Started on lisinopril 5 mg. Admission blood pressure was 211/100, discharge blood pressure 133/87. 2-D echo done and showed mild LVH and grade 1 diastolic dysfunction.  Chest pain:  Chronic , atypical , possibly due to anxiety  ACS ruled out by negative EKG and 3 sets of cardiac enzymes. 2-D echo showed no wall motion abnormalities, chest pain resolved today.  Procedures:  2-D echo done on 08/13/2015. Study Conclusions  - Left ventricle: The cavity size was normal. Wall thickness was increased in a  pattern of mild LVH. Systolic function was normal. The estimated ejection fraction was in the range of 50% to 55%. Wall motion was normal; there were no regional wall motion abnormalities. Doppler parameters are consistent with abnormal left ventricular relaxation (grade 1 diastolic dysfunction). - Pericardium, extracardiac: A trivial pericardial effusion was identified.  Consultations:  None  Discharge Exam: Filed Vitals:   08/13/15 0508  BP: 133/87  Pulse: 80  Temp: 98.2 F (36.8 C)  Resp: 18  General: Alert and awake, oriented x3, not in any acute distress. HEENT: anicteric sclera, pupils reactive to light and accommodation, EOMI CVS: S1-S2 clear, no murmur rubs or gallops Chest: clear to auscultation bilaterally, no wheezing, rales or rhonchi Abdomen: soft nontender, nondistended, normal bowel sounds, no organomegaly Extremities: no cyanosis, clubbing or edema noted bilaterally Neuro: Cranial nerves II-XII intact, no focal neurological deficits  Discharge Instructions   Discharge Instructions    Diet - low sodium heart healthy    Complete by:  As directed      Increase activity slowly    Complete by:  As directed           Current Discharge Medication List    CONTINUE these medications which have CHANGED   Details  lisinopril (PRINIVIL,ZESTRIL) 5 MG tablet Take 1 tablet (5 mg total) by mouth daily. Qty: 30 tablet, Refills: 3      CONTINUE these medications which have NOT CHANGED   Details  albuterol (PROVENTIL HFA;VENTOLIN HFA) 108 (90  BASE) MCG/ACT inhaler Inhale 1-2 puffs into the lungs every 6 (six) hours as needed for wheezing. Qty: 1 Inhaler, Refills: 0    Ascorbic Acid (VITAMIN C PO) Take 1 tablet by mouth daily.    ibuprofen (ADVIL,MOTRIN) 200 MG tablet Take 200 mg by mouth every 6 (six) hours as needed for moderate pain.    omeprazole (PRILOSEC) 20 MG capsule Take 20 mg by mouth 2 (two) times daily before a meal.       STOP taking these  medications     cephALEXin (KEFLEX) 500 MG capsule      ondansetron (ZOFRAN ODT) 8 MG disintegrating tablet        Allergies  Allergen Reactions  . Cyclobenzaprine Anaphylaxis    Pt  States  She  Is  Allergic  To  Muscle  relaxant   Follow-up Information    Follow up with REESE,BETTI D, MD In 1 week.   Specialty:  Family Medicine   Contact information:   [address] [place] [address] [phone]        The results of significant diagnostics from this hospitalization (including imaging, microbiology, ancillary and laboratory) are listed below for reference.    Significant Diagnostic Studies: Dg Chest 2 View  08/12/2015  CLINICAL DATA:  Hypertension and shortness of Breath tonight. EXAM: CHEST  2 VIEW COMPARISON:  11/14/2013 FINDINGS: The cardiac silhouette, mediastinal and hilar contours are within normal limits and stable. The lungs are clear. 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 Stable thoracic scoliosis. IMPRESSION: No acute cardiopulmonary findings. Electronically Signed   By: Rudie MeyerP.  Gallerani M.D.   On: 08/12/2015 18:25    Microbiology: No results found for this or any previous visit (from the past 240 hour(s)).   Labs: Basic Metabolic Panel:  Recent Labs Lab 08/12/15 1815 08/13/15 0705  NA 141 140  K 3.2* 3.8  CL 106 107  CO2 28 27  GLUCOSE 124* 111*  BUN 18 13  CREATININE 1.00 0.90  CALCIUM 9.4 9.5   Liver Function Tests: No results for input(s): AST, ALT, ALKPHOS, BILITOT, PROT, ALBUMIN in the last 168 hours. No results for input(s): LIPASE, AMYLASE in the last 168 hours. No results for input(s): AMMONIA in the last 168 hours. CBC:  Recent Labs Lab 08/12/15 1815  WBC 7.9  NEUTROABS 4.5  HGB 13.7  HCT 41.1  MCV 87.4  PLT 264   Cardiac Enzymes:  Recent Labs Lab 08/12/15 1815 08/13/15 0035 08/13/15 0705  TROPONINI <0.03 <0.03 <0.03   BNP: BNP (last 3 results) No results for input(s): BNP in the last  8760 hours.  ProBNP (last 3 results) No results for input(s): PROBNP in the last 8760 hours.  CBG: No results for input(s): GLUCAP in the last 168 hours.     Signed:  Giancarlo Askren A  [facility] 08/13/2015, 12:28 PM

## 2015-08-13 NOTE — Progress Notes (Signed)
Echocardiogram 2D Echocardiogram has been performed.  Nolon RodBrown, Tony 08/13/2015, 9:52 AM

## 2015-10-27 ENCOUNTER — Other Ambulatory Visit: Payer: Self-pay

## 2015-10-27 DIAGNOSIS — Z1231 Encounter for screening mammogram for malignant neoplasm of breast: Secondary | ICD-10-CM

## 2015-11-12 ENCOUNTER — Ambulatory Visit: Payer: Medicaid Other

## 2015-11-18 ENCOUNTER — Ambulatory Visit
Admission: RE | Admit: 2015-11-18 | Discharge: 2015-11-18 | Disposition: A | Discharge status: Home / Self Care | Payer: Medicaid Other | Source: Ambulatory Visit

## 2015-11-18 DIAGNOSIS — Z1231 Encounter for screening mammogram for malignant neoplasm of breast: Secondary | ICD-10-CM

## 2016-12-22 ENCOUNTER — Other Ambulatory Visit: Payer: Self-pay | Admitting: Unknown Physician Specialty

## 2016-12-22 DIAGNOSIS — Z1231 Encounter for screening mammogram for malignant neoplasm of breast: Secondary | ICD-10-CM

## 2017-01-24 ENCOUNTER — Ambulatory Visit: Payer: Medicaid Other

## 2017-02-10 ENCOUNTER — Ambulatory Visit
Admission: RE | Admit: 2017-02-10 | Discharge: 2017-02-10 | Disposition: A | Discharge status: Home / Self Care | Payer: Medicaid Other | Source: Ambulatory Visit | Attending: Unknown Physician Specialty | Admitting: Unknown Physician Specialty

## 2017-02-10 DIAGNOSIS — Z1231 Encounter for screening mammogram for malignant neoplasm of breast: Secondary | ICD-10-CM

## 2017-07-26 ENCOUNTER — Encounter (HOSPITAL_COMMUNITY): Payer: Self-pay | Admitting: Emergency Medicine

## 2017-07-26 ENCOUNTER — Emergency Department (HOSPITAL_COMMUNITY): Payer: Self-pay

## 2017-07-26 ENCOUNTER — Emergency Department (HOSPITAL_COMMUNITY)
Admission: EM | Admit: 2017-07-26 | Discharge: 2017-07-26 | Disposition: A | Discharge status: Home / Self Care | Payer: Self-pay | Attending: Emergency Medicine | Admitting: Emergency Medicine

## 2017-07-26 DIAGNOSIS — Z79899 Other long term (current) drug therapy: Secondary | ICD-10-CM | POA: Insufficient documentation

## 2017-07-26 DIAGNOSIS — I10 Essential (primary) hypertension: Secondary | ICD-10-CM | POA: Insufficient documentation

## 2017-07-26 DIAGNOSIS — Z87891 Personal history of nicotine dependence: Secondary | ICD-10-CM | POA: Insufficient documentation

## 2017-07-26 DIAGNOSIS — R079 Chest pain, unspecified: Secondary | ICD-10-CM | POA: Insufficient documentation

## 2017-07-26 LAB — BASIC METABOLIC PANEL
Anion gap: 8 (ref 5–15)
BUN: 16 mg/dL (ref 6–20)
CHLORIDE: 107 mmol/L (ref 101–111)
CO2: 27 mmol/L (ref 22–32)
CREATININE: 0.82 mg/dL (ref 0.44–1.00)
Calcium: 9.4 mg/dL (ref 8.9–10.3)
GFR calc Af Amer: >60 mL/min (ref >60)
GFR calc non Af Amer: >60 mL/min (ref >60)
Glucose, Bld: 117 mg/dL — ABNORMAL HIGH (ref 65–99)
Potassium: 3.5 mmol/L (ref 3.5–5.1)
SODIUM: 142 mmol/L (ref 135–145)

## 2017-07-26 LAB — CBC
HCT: 41.9 % (ref 36.0–46.0)
Hemoglobin: 14 g/dL (ref 12.0–15.0)
MCH: 29.9 pg (ref 26.0–34.0)
MCHC: 33.4 g/dL (ref 30.0–36.0)
MCV: 89.3 fL (ref 78.0–100.0)
Platelets: 258 10*3/uL (ref 150–400)
RBC: 4.69 MIL/uL (ref 3.87–5.11)
RDW: 13 % (ref 11.5–15.5)
WBC: 8 10*3/uL (ref 4.0–10.5)

## 2017-07-26 LAB — POCT I-STAT TROPONIN I
Troponin i, poc: 0.01 ng/mL (ref 0.00–0.08)
Troponin i, poc: 0.01 ng/mL (ref 0.00–0.08)

## 2017-07-26 LAB — D-DIMER, QUANTITATIVE (NOT AT ARMC)

## 2017-07-26 MED ORDER — NITROGLYCERIN 0.4 MG SL SUBL: 0.4000 mg | SUBLINGUAL_TABLET | SUBLINGUAL | Status: DC | PRN

## 2017-07-26 MED ORDER — AMLODIPINE BESYLATE 5 MG PO TABS: 10.0000 mg | ORAL_TABLET | Freq: Every day | ORAL | 2 refills | Status: DC

## 2017-07-26 MED ORDER — AMLODIPINE BESYLATE 5 MG PO TABS
5.0000 mg | ORAL_TABLET | Freq: Once | ORAL | Status: AC
  Administered 2017-07-26: 5 mg via ORAL
  Filled 2017-07-26: qty 1

## 2017-07-26 MED ORDER — AMLODIPINE BESYLATE 5 MG PO TABS: 10.0000 mg | ORAL_TABLET | Freq: Every day | ORAL | 0 refills | Status: DC

## 2017-07-26 MED ORDER — ASPIRIN 81 MG PO CHEW
324.0000 mg | CHEWABLE_TABLET | Freq: Once | ORAL | Status: AC
  Administered 2017-07-26: 324 mg via ORAL
  Filled 2017-07-26: qty 4

## 2017-07-26 NOTE — ED Notes (Signed)
Pt reports lots of stress in her life currently. Including moving, attempting to get her job back and expecting a grandchild

## 2017-07-26 NOTE — ED Provider Notes (Signed)
[facility]-EMERGENCY DEPT Provider Note   CSN: [id number] Arrival date & time: 07/26/17  1331     History   Chief Complaint Chief Complaint  Patient presents with  . Chest Pain    HPI Shelby Rojas is a 56 y.o. female.  HPI  2-3 days of chest pain, hurts under left arm, left lateral chest Worse with deep breaths, sharp pain Feels like a muscle pull Coming and going Then wakes up in the middle of the night with the same pain, 7/10 Has associated shortness of breath No nausea vomiting/no syncope.  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  Has been lifting/flipping mattresses. Pain not worsened by movement.  It is spontaneous  No cigarettes, no other drugs, no family history  Have been out of blood pressure medication for years  Past Medical History:  Diagnosis Date  . High cholesterol   . Hypertension   . PNA (pneumonia)     Patient Active Problem List   Diagnosis Date Noted  . Chest pain 08/12/2015    Past Surgical History:  Procedure Laterality Date  . CESAREAN SECTION      OB History    No data available       Home Medications    Prior to Admission medications   Medication Sig Start Date End Date Taking? Authorizing Provider  Ascorbic Acid (VITAMIN C PO) Take 1 tablet by mouth daily.   Yes [provider]  ibuprofen (ADVIL,MOTRIN) 200 MG tablet Take 200 mg by mouth every 6 (six) hours as needed for moderate pain.   Yes [provider]  albuterol (PROVENTIL HFA;VENTOLIN HFA) 108 (90 BASE) MCG/ACT inhaler Inhale 1-2 puffs into the lungs every 6 (six) hours as needed for  wheezing. Patient not taking: Reported on 07/26/2017 10/26/12   Moreno-Coll, Adlih, MD  lisinopril (PRINIVIL,ZESTRIL) 5 MG tablet Take 1 tablet (5 mg total) by mouth daily. 08/13/15 08/12/16  Clydia Llano, MD    Family History Family History  Problem Relation Age of Onset  . Breast cancer Maternal Aunt     Social History Social History  Substance Use Topics  . Smoking status: Former Games developer  . Smokeless tobacco: Never Used  . Alcohol use No     Allergies   Cyclobenzaprine   Review of Systems Review of Systems  Constitutional: Negative for fever.  HENT: Negative for sore throat.   Eyes: Negative for visual disturbance.  Respiratory: Positive for shortness of breath. Negative for cough.   Cardiovascular: Positive for chest pain.  Gastrointestinal: Negative for abdominal pain, nausea and vomiting.  Genitourinary: Negative for difficulty urinating.  Musculoskeletal: Negative for back pain and neck pain.  Skin: Negative for rash.  Neurological: Negative for syncope and headaches.     Physical Exam Updated Vital Signs BP (!) 203/101 (BP Location: Right Arm)   Pulse 80   Temp 98 F (36.7 C) (Oral)   Ht 5' 5.5" (1.664 m)   SpO2 98%   Physical Exam  Constitutional: She is oriented to person, place, and time. She appears well-developed and well-nourished. No distress.  HENT:  Head: Normocephalic and atraumatic.  Eyes: Conjunctivae and EOM are normal.  Neck: Normal range of motion.  Cardiovascular: Normal rate, regular rhythm, normal heart sounds and intact distal pulses.  Exam reveals no gallop and no friction rub.   No murmur heard. Pulmonary/Chest: Effort normal and breath sounds normal. No respiratory distress. She has no wheezes. She has no rales.  Abdominal: Soft. She exhibits no distension. There is no tenderness. There is no guarding.  Musculoskeletal: She exhibits no edema or tenderness.  Neurological: She is alert and oriented to person, place, and time.  Skin:  Skin is warm and dry. No rash noted. She is not diaphoretic. No erythema.  Nursing note and vitals reviewed.    ED Treatments / Results  Labs (all labs ordered are listed, but only abnormal results are displayed) Labs Reviewed  BASIC METABOLIC PANEL - Abnormal; Notable for the following:       Result Value   Glucose, Bld 117 (*)    All other components within normal limits  CBC  D-DIMER, QUANTITATIVE (NOT AT [facility])  I-STAT TROPONIN, ED  POCT I-STAT TROPONIN I    EKG  EKG Interpretation  Date/Time:  Tuesday July 26 2017 13:37:31 EDT Ventricular Rate:  78 PR Interval:    QRS Duration: 92 QT Interval:  410 QTC Calculation: 467 R Axis:   76 Text Interpretation:  Sinus rhythm Borderline repolarization abnormality Baseline wander in lead(s) V2 V6 No significant change since last tracing Confirmed by Alvira MondaySchlossman, Joci Dress ([id number]) on 07/26/2017 3:35:47 PM       Radiology Dg Chest 2 View  Result Date: 07/26/2017 CLINICAL DATA:  Left chest pain. EXAM: CHEST  2 VIEW COMPARISON:  08/12/2015. FINDINGS: Mediastinum and hilar structures normal. Lungs are clear. Heart size normal. Thoracic spine scoliosis and degenerative change . IMPRESSION: No acute abnormality . Electronically Signed   By: Maisie Fushomas  Register   On: 07/26/2017 14:15    Procedures Procedures (including critical care time)  Medications Ordered in ED Medications  aspirin chewable tablet 324 mg (not administered)  nitroGLYCERIN (NITROSTAT) SL tablet 0.4 mg (not administered)  amLODipine (NORVASC) tablet 5 mg (not administered)     Initial Impression / Assessment and Plan / ED Course  I have reviewed the triage vital signs and the nursing notes.  Pertinent labs & imaging results that were available during my care of the patient were reviewed by me and considered in my medical decision making (see chart for details).     56 year old female with a history of hypertension and hyperlipidemia presents with concern for  chest pain. EKG shows no significant findings.  CBC shows normal hemoglobin. Initial troponin negative. Doubt dissection given normal pulses bilateral, history, physical not consistent.  Patient low risk wells with ddimer negative.  Chest pain is atypical, sharp, worse with palpation, not worse with exertion and suspect more likely musculoskeletal etiology.  Recommend outpatient evaluation of symptoms.   Blood pressures elevated to 200 systolic with patient not on blood pressure medications. Given nitro for blood pressures, followed by dose of amlodipine 10mg .  Patient without chest pain in ED, no sign of hypertensive emergency.  Troponin negative x2.  Will initiate home antihypertensive regimen 10mg  amlodipine and recommend PCP follow up.   Final Clinical Impressions(s) / ED Diagnoses   Final diagnoses:  Chest pain, unspecified type  Essential hypertension    New Prescriptions New Prescriptions   No medications on file     Alvira MondaySchlossman, Javin Nong, MD 07/27/17 [phone]

## 2017-07-26 NOTE — ED Triage Notes (Signed)
patient c/o left sided chest pain that has been intermittent over past couple weeks. Patient states pain is worse with deep breathing. Patient HTN but takes meds "when I know it is coming on".

## 2017-07-28 ENCOUNTER — Ambulatory Visit: Payer: Self-pay | Admitting: Cardiology

## 2017-08-02 ENCOUNTER — Encounter: Payer: Self-pay | Admitting: Physician Assistant

## 2017-08-02 NOTE — Progress Notes (Deleted)
Cardiology Office Note    Date:  08/02/2017  ID:  Shelby Rojas, Shelby Rojas [birth year], MRN [id number] PCP:  Juliann Pares, DO  Cardiologist:  New, reviewed with ***   Chief Complaint: chest pain  History of Present Illness:  Shelby Rojas is a 56 y.o. female with history of HTN, obesity, anxiety, panic attacks, obesity, GERD, hyperglycemia who is being seen today for the evaluation of chest pain at the request of Dr. Dalene Seltzer.    Remote echo done in 2016 for chest pain (ruled out) showed mild LVH, EF 50-55%, grade 1 DD, trivial pericardial effusion.  She was seen in the ED 07/26/17 for chest pain at which time troponins and d-dimer were negative, CBC wnl, K 3.5, glucose 117, Cr 0.82. CXR NAD. Lipid status unavailable in EPIC. She was noted to have SBP elevated to 200 systolic, not on meds. She was given NTG and amlodipine.  Chest pain Hypertension Hyperglycemia Diastolic dysfunction without heart failure   Past Medical History:  Diagnosis Date  . Anxiety   . Chest pain   . 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 GERD (gastroesophageal reflux disease)   . High cholesterol   . Hypertension   . Obesity   . Panic attacks   . PNA (pneumonia)     Past Surgical History:  Procedure Laterality Date  . CESAREAN SECTION      Current Medications: No outpatient prescriptions have been marked as taking for the 08/04/17 encounter (Appointment) with Laurann Montana, PA-C.     Allergies:   Cyclobenzaprine   Social History   Social History  . Marital status: Single    Spouse name: N/A  . Number of children: N/A  . Years of education: N/A   Social History Main Topics  . Smoking status: Former Games developer  . Smokeless tobacco: Never Used  . Alcohol use No  . Drug use: No  . Sexual activity: Not on file   Other Topics Concern  . Not on file   Social History Narrative  . No narrative on file     Family History:  Family History  Problem Relation Age of Onset   . Breast cancer Maternal Aunt    ***  ROS:   Please see the history of present illness. Otherwise, review of systems is positive for ***.  All other systems are reviewed and otherwise negative.    PHYSICAL EXAM:   VS:  There were no vitals taken for this visit.  BMI: There is no height or weight on file to calculate BMI. GEN: Well nourished, well developed, in no acute distress  HEENT: normocephalic, atraumatic Neck: no JVD, carotid bruits, or masses Cardiac: ***RRR; no murmurs, rubs, or gallops, no edema  Respiratory:  clear to auscultation bilaterally, normal work of breathing GI: soft, nontender, nondistended, + BS MS: no deformity or atrophy  Skin: warm and dry, no rash Neuro:  Alert and Oriented x 3, Strength and sensation are intact, follows commands Psych: euthymic mood, full affect  Wt Readings from Last 3 Encounters:  08/12/15 176 lb (79.8 kg)  11/14/13 176 lb (79.8 kg)  02/16/12 193 lb (87.5 kg)      Studies/Labs Reviewed:   EKG:  EKG was ordered today and personally reviewed by me and demonstrates *** EKG was not ordered today.***  Recent Labs: 07/26/2017: BUN 16; Creatinine, Ser 0.82; Hemoglobin 14.0; Platelets 258; Potassium 3.5; Sodium 142   Lipid Panel No results found for: CHOL, TRIG, HDL, CHOLHDL, VLDL,  LDLCALC, LDLDIRECT  Additional studies/ records that were reviewed today include: Summarized above.***    ASSESSMENT & PLAN:   This patient's case was discussed in depth with Dr. Marland Kitchen***. The plan below was formulated per our discussion.  1. ***  Disposition: F/u with ***   Medication Adjustments/Labs and Tests Ordered: Current medicines are reviewed at length with the patient today.  Concerns regarding medicines are outlined above. Medication changes, Labs and Tests ordered today are summarized above and listed in the Patient Instructions accessible in Encounters.   Signed, Laurann Montanaayna N Rosalina Dingwall, PA-C  08/02/2017 8:31 PM    [facility] [address], [place], [place]  [address] Phone: [phone]([phone]; Fax: [phone]

## 2017-08-04 ENCOUNTER — Ambulatory Visit: Payer: Medicaid Other | Admitting: Physician Assistant

## 2017-08-05 NOTE — Progress Notes (Deleted)
Cardiology Office Note    Date:  08/05/2017  ID:  Shelby Rojas, DOB 06/02/1961, MRN [id number] PCP:  Juliann ParesPowell, Michele, DO  Cardiologist:  New, reviewed with ***   Chief Complaint: chest pain  History of Present Illness:  Shelby Rojas is a 56 y.o. female with history of HTN, obesity, anxiety, panic attacks, obesity, GERD, hyperglycemia who is being seen today for the evaluation of chest pain at the request of Dr. Dalene SeltzerSchlossman.   Remote echo done in 2016 for chest pain (ruled out) showed mild LVH, EF 50-55%, grade 1 DD, trivial pericardial effusion.   She was seen in the ED 07/26/17 for chest pain at which time troponins and d-dimer were negative, CBC wnl, K 3.5, glucose 117, Cr 0.82. CXR NAD. Lipid status unavailable in EPIC. She was noted to have SBP elevated to 200 systolic, not on meds. She was given NTG and amlodipine.   Chest pain  Hypertension  Hyperglycemia  Diastolic dysfunction without heart failure     Past Medical History:  Diagnosis Date  . Anxiety   . Chest pain   . 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 GERD (gastroesophageal reflux disease)   . High cholesterol   . Hypertension   . Obesity   . Panic attacks   . PNA (pneumonia)     Past Surgical History:  Procedure Laterality Date  . CESAREAN SECTION      Current Medications: No outpatient prescriptions have been marked as taking for the 08/08/17 encounter (Appointment) with Laurann Montanaunn, Rafel Garde N, PA-C.     Allergies:   Cyclobenzaprine   Social History   Social History  . Marital status: Single    Spouse name: N/A  . Number of children: N/A  . Years of education: N/A   Social History Main Topics  . Smoking status: Former Games developermoker  . Smokeless tobacco: Never Used  . Alcohol use No  . Drug use: No  . Sexual activity: Not on file   Other Topics Concern  . Not on file   Social History Narrative  . No narrative on file     Family History:  Family History  Problem Relation Age of  Onset  . Breast cancer Maternal Aunt    ***  ROS:   Please see the history of present illness. Otherwise, review of systems is positive for ***.  All other systems are reviewed and otherwise negative.    PHYSICAL EXAM:   VS:  There were no vitals taken for this visit.  BMI: There is no height or weight on file to calculate BMI. GEN: Well nourished, well developed, in no acute distress  HEENT: normocephalic, atraumatic Neck: no JVD, carotid bruits, or masses Cardiac: ***RRR; no murmurs, rubs, or gallops, no edema  Respiratory:  clear to auscultation bilaterally, normal work of breathing GI: soft, nontender, nondistended, + BS MS: no deformity or atrophy  Skin: warm and dry, no rash Neuro:  Alert and Oriented x 3, Strength and sensation are intact, follows commands Psych: euthymic mood, full affect  Wt Readings from Last 3 Encounters:  08/12/15 176 lb (79.8 kg)  11/14/13 176 lb (79.8 kg)  02/16/12 193 lb (87.5 kg)      Studies/Labs Reviewed:   EKG:  EKG was ordered today and personally reviewed by me and demonstrates *** EKG was not ordered today.***  Recent Labs: 07/26/2017: BUN 16; Creatinine, Ser 0.82; Hemoglobin 14.0; Platelets 258; Potassium 3.5; Sodium 142   Lipid Panel No results found  for: CHOL, TRIG, HDL, CHOLHDL, VLDL, LDLCALC, LDLDIRECT  Additional studies/ records that were reviewed today include: Summarized above.***    ASSESSMENT & PLAN:   This patient's case was discussed in depth with Dr. Marland Kitchen The plan below was formulated per our discussion.  1. ***  Disposition: F/u with ***   Medication Adjustments/Labs and Tests Ordered: Current medicines are reviewed at length with the patient today.  Concerns regarding medicines are outlined above. Medication changes, Labs and Tests ordered today are summarized above and listed in the Patient Instructions accessible in Encounters.   Signed, Laurann Montana, PA-C  08/05/2017 3:59 PM    [facility] [address] [place], [place], [place]  [address] Phone: [phone]; Fax: [phone]

## 2017-08-08 ENCOUNTER — Ambulatory Visit: Payer: Medicaid Other | Admitting: Physician Assistant

## 2017-08-11 ENCOUNTER — Encounter (INDEPENDENT_AMBULATORY_CARE_PROVIDER_SITE_OTHER): Payer: Self-pay

## 2017-08-11 ENCOUNTER — Encounter: Payer: Self-pay | Admitting: Interventional Cardiology

## 2017-08-11 ENCOUNTER — Ambulatory Visit (INDEPENDENT_AMBULATORY_CARE_PROVIDER_SITE_OTHER): Payer: Self-pay | Admitting: Interventional Cardiology

## 2017-08-11 VITALS — BP 178/92 | HR 68 | Ht 66.0 in | Wt 195.8 lb

## 2017-08-11 DIAGNOSIS — I10 Essential (primary) hypertension: Secondary | ICD-10-CM

## 2017-08-11 DIAGNOSIS — R072 Precordial pain: Secondary | ICD-10-CM

## 2017-08-11 DIAGNOSIS — E669 Obesity, unspecified: Secondary | ICD-10-CM

## 2017-08-11 MED ORDER — LISINOPRIL 10 MG PO TABS: 10.0000 mg | ORAL_TABLET | Freq: Every day | ORAL | 11 refills | Status: DC

## 2017-08-11 NOTE — Progress Notes (Signed)
Cardiology Office Note   Date:  08/11/2017   ID:  Shelby Rojas, DOB 08/02/1961, MRN [id number]  PCP:  Juliann ParesPowell, Michele, DO    No chief complaint on file. chest pain   Wt Readings from Last 3 Encounters:  08/11/17 195 lb 12.8 oz (88.8 kg)  08/12/15 176 lb (79.8 kg)  11/14/13 176 lb (79.8 kg)       History of Present Illness: Shelby Rojas is a 56 y.o. female who was seen in the emergency room due to chest discomfort.  She described it under her left arm and left side of her chest.  It was worse with deep breaths.  It was a sharp pain.  It had been going on for several days.    Shelby Rojas is a 56 y.o. female who is being seen today for the evaluation of chest pain at the request of Juliann Paresowell, Michele, DO.  ECHO in 2016: Left ventricle: The cavity size was normal. Wall thickness was   increased in a pattern of mild LVH. Systolic function was normal.   The estimated ejection fraction was in the range of 50% to 55%.   Wall motion was normal; there were no regional wall motion   abnormalities. Doppler parameters are consistent with abnormal   left ventricular relaxation (grade 1 diastolic dysfunction).  She went to the emergency room on October 23.  She did have high blood pressure at the time.  Other workup was negative.  She was started on amlodipine.  She is here for follow-up.  Since then, she has continued to have sharp pain.  She does not recall having a stress test.  BP is not checked at home.  She does have a cuff.  She has been drug free for 11 years.  She used cocaine in the past.    No early CAD in her family.  HTN runs in the family.    Past Medical History:  Diagnosis Date  . Anxiety   . Chest pain   . 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 GERD (gastroesophageal reflux disease)   . High cholesterol   . Hypertension   . Obesity   . Panic attacks   . PNA (pneumonia)     Past Surgical History:  Procedure Laterality Date  .  CESAREAN SECTION       Current Outpatient Medications  Medication Sig Dispense Refill  . amLODipine (NORVASC) 5 MG tablet Take 2 tablets (10 mg total) by mouth daily. 12 tablet 2  . ibuprofen (ADVIL,MOTRIN) 200 MG tablet Take 200 mg by mouth every 6 (six) hours as needed for moderate pain.     No current facility-administered medications for this visit.     Allergies:   Cyclobenzaprine ; no allergies to BP meds; no known problems with ACE-I/lisinopril   Social History:  The patient  reports that she has quit smoking. she has never used smokeless tobacco. She reports that she does not drink alcohol or use drugs.   Family History:  The patient's \ family history includes Breast cancer in her maternal aunt.    ROS:  Please see the history of present illness.   Otherwise, review of systems are positive for chest pain.   All other systems are reviewed and negative.    PHYSICAL EXAM: VS:  BP (!) 178/92 (BP Location: Left Arm, Patient Position: Sitting, Cuff Size: Normal)   Pulse 68   Ht 5\' 6"  (1.676 m)   Wt 195  lb 12.8 oz (88.8 kg)   SpO2 98%   BMI 31.60 kg/m  , BMI Body mass index is 31.6 kg/m. GEN: Well nourished, well developed, in no acute distress  HEENT: normal  Neck: no JVD, carotid bruits, or masses Cardiac: RRR; 2/6 systolic murmurs,no rubs, or gallops,no edema  Respiratory:  clear to auscultation bilaterally, normal work of breathing GI: soft, nontender, nondistended, + BS MS: no deformity or atrophy  Skin: warm and dry, no rash Neuro:  Strength and sensation are intact Psych: euthymic mood, full affect   EKG:   The ekg ordered today demonstrates NSR, nonspecific ST segment changes   Recent Labs: 07/26/2017: BUN 16; Creatinine, Ser 0.82; Hemoglobin 14.0; Platelets 258; Potassium 3.5; Sodium 142   Lipid Panel No results found for: CHOL, TRIG, HDL, CHOLHDL, VLDL, LDLCALC, LDLDIRECT   Other studies Reviewed: Additional studies/ records that were reviewed today  with results demonstrating: Cr 0.83 in 10/18.   ASSESSMENT AND PLAN:  1. Chest pain: Several atypical features.  Not related to exertion.  Plan to see how sx are when her BP is better controlled 2. Hypertension: Start lisinopril.  Low salt diet.  She would benefit from exercise as noted below and weight loss.  3. Obesity: We spoke about the importance of weight loss.  THis will help with BP.     Current medicines are reviewed at length with the patient today.  The patient concerns regarding her medicines were addressed.  The following changes have been made:  Start lisinopril  Labs/ tests ordered today include:  No orders of the defined types were placed in this encounter.   Recommend 150 minutes/week of aerobic exercise Low fat, low carb, high fiber diet recommended  Disposition:   FU in HTN clinic   Signed, Lance MussJayadeep Varanasi, MD  08/11/2017 10:52 AM    [facility] [address], [place], [place]  [address] Phone: [phone](336) [phone]; Fax: [phone](336) [phone]

## 2017-08-11 NOTE — Patient Instructions (Signed)
Medication Instructions:  Your physician has recommended you make the following change in your medication:   START: Lisinopril 10 mg daily  Labwork: Your physician recommends that you return for lab work in: 1 week for BMET  Testing/Procedures: None ordered  Follow-Up: Your physician recommends that you schedule a follow-up appointment in: 2 weeks or first available appointment with the Hypertension Clinic for Blood Pressure Management.    Any Other Special Instructions Will Be Listed Below (If Applicable).     If you need a refill on your cardiac medications before your next appointment, please call your pharmacy.

## 2017-08-19 ENCOUNTER — Other Ambulatory Visit: Payer: Self-pay | Admitting: *Deleted

## 2017-08-19 DIAGNOSIS — R072 Precordial pain: Secondary | ICD-10-CM

## 2017-08-19 DIAGNOSIS — I10 Essential (primary) hypertension: Secondary | ICD-10-CM

## 2017-08-20 LAB — BASIC METABOLIC PANEL
BUN/Creatinine Ratio: 18 (ref 9–23)
BUN: 17 mg/dL (ref 6–24)
CALCIUM: 10.1 mg/dL (ref 8.7–10.2)
CHLORIDE: 103 mmol/L (ref 96–106)
CO2: 26 mmol/L (ref 20–29)
Creatinine, Ser: 0.94 mg/dL (ref 0.57–1.00)
GFR calc Af Amer: 78 mL/min/{1.73_m2} (ref >59)
GFR calc non Af Amer: 68 mL/min/{1.73_m2} (ref >59)
GLUCOSE: 85 mg/dL (ref 65–99)
POTASSIUM: 3.9 mmol/L (ref 3.5–5.2)
Sodium: 143 mmol/L (ref 134–144)

## 2017-08-24 ENCOUNTER — Ambulatory Visit: Payer: Self-pay | Admitting: Cardiology

## 2017-08-31 NOTE — Progress Notes (Deleted)
Patient ID: Shelby BrayVeronica R Hice                 DOB: 07/14/1961                      MRN: [id number]     HPI: Shelby Rojas is a 56 y.o. female referred by Dr. Isabel CapriceVaranassi to HTN clinic. PMH is significant for HTN, anxiety, and GERD. Pt seen in ED 07/2017 for chest discomfort with negative workup and found to have elevated BP so she was started on amlodipine. Pt referred to Dr. Isabel CapriceVaranassi who started lisinopril 08/2017, F/U BMET was stable and wnl.  Pt presents *** Chlorthalidone vs titrate lisinopril  Current HTN meds: lisinopril 10mg  daily, amlodipine 10mg  daily Previously tried:  BP goal: <130/80 mmHg  Family History: No known cardiac history in family ***  Social History: Former tobacco abuse ***  Diet: ***  Exercise: ***  Home BP readings: ***  Wt Readings from Last 3 Encounters:  08/11/17 195 lb 12.8 oz (88.8 kg)  08/12/15 176 lb (79.8 kg)  11/14/13 176 lb (79.8 kg)   BP Readings from Last 3 Encounters:  08/11/17 (!) 178/92  07/26/17 (!) 206/106  08/13/15 133/87   Pulse Readings from Last 3 Encounters:  08/11/17 68  07/26/17 69  08/13/15 80    Renal function: CrCl cannot be calculated (Unknown ideal weight.).  Past Medical History:  Diagnosis Date  . Anxiety   . Chest pain   . 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 GERD (gastroesophageal reflux disease)   . High cholesterol   . Hypertension   . Obesity   . Panic attacks   . PNA (pneumonia)     Current Outpatient Medications on File Prior to Visit  Medication Sig Dispense Refill  . amLODipine (NORVASC) 5 MG tablet Take 2 tablets (10 mg total) by mouth daily. 12 tablet 2  . ibuprofen (ADVIL,MOTRIN) 200 MG tablet Take 200 mg by mouth every 6 (six) hours as needed for moderate pain.    Marland Kitchen. lisinopril (PRINIVIL,ZESTRIL) 10 MG tablet Take 1 tablet (10 mg total) daily by mouth. 30 tablet 11   No current facility-administered medications on file prior to visit.     Allergies  Allergen Reactions    . Cyclobenzaprine Anaphylaxis    Pt  States  She  Is  Allergic  To  Muscle  relaxant     Assessment/Plan:  1. Hypertension -

## 2017-09-01 ENCOUNTER — Ambulatory Visit: Payer: Self-pay

## 2017-09-07 NOTE — Progress Notes (Unsigned)
Patient ID: Shelby BrayVeronica R Deakins                 DOB: 08/17/1961                      MRN: [id number]     HPI: Shelby Rojas is a 56 y.o. female referred by Dr. Eldridge DaceVaranasi to HTN clinic. 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 Patient was recently seen in the ED on 07/26/17 due to chest discomfort, which she described as a sharp pain under her left arm and left side of her chest and was worse with deep breaths. Her BP in the hospital that day was 206/106 and she was started on amlodipine 5mg  BID. She reported being out of her BP meds for years. At follow up on 08/11/17, BP remained elevated at 178/92 and she continued to have sharp pain. Lisinopril 10mg  daily was started at this visit.  Patient uses ibuprofen 200mg  q6h PRN pain.  Current HTN meds: amlodipine 5mg  BID, lisinopril 10mg  daily Previously tried: our records show she was discharged on lisinopril 5mg  08/12/15(-08/12/16), previously [facility] records show lisinopril 20mg  (09/2013 - 10/2013) 40mg  daily (10/2013-?) - H&P from 08/12/15 states she hasn't had insurance and wasn't taking meds at that time BP goal: < 130/80 mmHg  Family History: HTN, no CAD. Breast cancer in maternal aunt.  Social History: Former smoker, quit smoking 2015? Never used smokeless tobacco. Denies alcohol use. Has been drug free for 11 years, used cocaine in the past.  Diet:   Exercise:   Home BP readings:   Labs:  08/19/17: Scr 0.94, K 3.9 07/26/17: Scr 0.82, K 3.5  Wt Readings from Last 3 Encounters:  08/11/17 195 lb 12.8 oz (88.8 kg)  08/12/15 176 lb (79.8 kg)  11/14/13 176 lb (79.8 kg)   BP Readings from Last 3 Encounters:  08/11/17 (!) 178/92  07/26/17 (!) 206/106  08/13/15 133/87   Pulse Readings from Last 3 Encounters:  08/11/17 68  07/26/17 69  08/13/15 80    Renal function: CrCl cannot be calculated (Unknown ideal weight.).  Past Medical History:  Diagnosis Date  .  Anxiety   . Chest pain   . 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 GERD (gastroesophageal reflux disease)   . High cholesterol   . Hypertension   . Obesity   . Panic attacks   . PNA (pneumonia)     Current Outpatient Medications on File Prior to Visit  Medication Sig Dispense Refill  . amLODipine (NORVASC) 5 MG tablet Take 2 tablets (10 mg total) by mouth daily. 12 tablet 2  . ibuprofen (ADVIL,MOTRIN) 200 MG tablet Take 200 mg by mouth every 6 (six) hours as needed for moderate pain.    Marland Kitchen. lisinopril (PRINIVIL,ZESTRIL) 10 MG tablet Take 1 tablet (10 mg total) daily by mouth. 30 tablet 11   No current facility-administered medications on file prior to visit.     Allergies  Allergen Reactions  . Cyclobenzaprine Anaphylaxis    Pt  States  She  Is  Allergic  To  Muscle  relaxant     Assessment/Plan:  1. Hypertension - Patient's BP is *** today in clinic, which is *** her BP goal of < 130/80 mmHg. Will increase lisinopril to 20mg  daily. Recheck BMET in 2 weeks. Follow up in HTN clinic in 4 weeks.

## 2017-09-08 ENCOUNTER — Ambulatory Visit: Payer: Self-pay | Admitting: Pharmacist

## 2018-02-01 ENCOUNTER — Emergency Department (HOSPITAL_COMMUNITY)
Admission: EM | Admit: 2018-02-01 | Discharge: 2018-02-01 | Disposition: A | Discharge status: Home / Self Care | Payer: Self-pay | Attending: Emergency Medicine | Admitting: Emergency Medicine

## 2018-02-01 ENCOUNTER — Encounter (HOSPITAL_COMMUNITY): Payer: Self-pay | Admitting: *Deleted

## 2018-02-01 DIAGNOSIS — I10 Essential (primary) hypertension: Secondary | ICD-10-CM | POA: Insufficient documentation

## 2018-02-01 DIAGNOSIS — Z87891 Personal history of nicotine dependence: Secondary | ICD-10-CM | POA: Insufficient documentation

## 2018-02-01 DIAGNOSIS — E876 Hypokalemia: Secondary | ICD-10-CM | POA: Insufficient documentation

## 2018-02-01 DIAGNOSIS — Z79899 Other long term (current) drug therapy: Secondary | ICD-10-CM | POA: Insufficient documentation

## 2018-02-01 DIAGNOSIS — R197 Diarrhea, unspecified: Secondary | ICD-10-CM | POA: Insufficient documentation

## 2018-02-01 LAB — I-STAT CHEM 8, ED
BUN: 15 mg/dL (ref 6–20)
CHLORIDE: 103 mmol/L (ref 101–111)
CREATININE: 1.1 mg/dL — AB (ref 0.44–1.00)
Calcium, Ion: 1.16 mmol/L (ref 1.15–1.40)
GLUCOSE: 86 mg/dL (ref 65–99)
HEMATOCRIT: 41 % (ref 36.0–46.0)
Hemoglobin: 13.9 g/dL (ref 12.0–15.0)
POTASSIUM: 2.9 mmol/L — AB (ref 3.5–5.1)
Sodium: 142 mmol/L (ref 135–145)
TCO2: 27 mmol/L (ref 22–32)

## 2018-02-01 MED ORDER — POTASSIUM CHLORIDE CRYS ER 20 MEQ PO TBCR
20.0000 meq | EXTENDED_RELEASE_TABLET | Freq: Once | ORAL | Status: AC
  Administered 2018-02-01: 20 meq via ORAL
  Filled 2018-02-01: qty 1

## 2018-02-01 NOTE — ED Triage Notes (Signed)
Pt states she had vomiting and diarrhea since yesterday. Pt denies abdominal pain, blood in stool or bloody emesis. Pt's employer wanted pt to be evaluated before coming back to work.

## 2018-02-01 NOTE — ED Provider Notes (Signed)
[facility]-EMERGENCY DEPT Provider Note   CSN: [id number] Arrival date & time: 02/01/18  1320     History   Chief Complaint Chief Complaint  Patient presents with  . Emesis  . Diarrhea  . needs work note    HPI Shelby Rojas is a 57 y.o. female who presents to the ED for a work note. Patient reports that she went to work yesterday at [employer] and had to leave early due to feeling bad and having n/v/d. Patient reports that she went home and vomited x 3 and had 5 episodes of diarrhea. After that she felt better. No n/v/d since then. Patient reports going to work this morning but they told her she would need a note before she could return. Patient reports feeling hungry and having no further symptoms today.  HPI  Past Medical History:  Diagnosis Date  . Anxiety   . Chest pain   . 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 GERD (gastroesophageal reflux disease)   . High cholesterol   . Hypertension   . Obesity   . Panic attacks   . PNA (pneumonia)     Patient Active Problem List   Diagnosis Date Noted  . Chest pain 08/12/2015  . Essential hypertension 05/09/2013    Past Surgical History:  Procedure Laterality Date  . CESAREAN SECTION       OB History   None      Home Medications    Prior to Admission medications   Medication Sig Start Date End Date Taking? Authorizing Provider  amLODipine (NORVASC) 5 MG tablet Take 2 tablets (10 mg total) by mouth daily. 07/26/17   Alvira Monday, MD  ibuprofen (ADVIL,MOTRIN) 200 MG tablet Take 200 mg by mouth every 6 (six) hours as needed for moderate pain.    [provider]  lisinopril (PRINIVIL,ZESTRIL) 10 MG tablet Take 1 tablet (10 mg total) daily by mouth. 08/11/17 11/09/17  Laurann Montana, PA-C    Family History Family History  Problem Relation Age of Onset  . Breast cancer Maternal Aunt     Social History Social History   Tobacco Use  . Smoking status: Former Games developer    . Smokeless tobacco: Never Used  Substance Use Topics  . Alcohol use: No  . Drug use: No     Allergies   Cyclobenzaprine   Review of Systems Review of Systems  Gastrointestinal:       N/v/d yesterday that have all resolved.   All other systems reviewed and are negative.    Physical Exam Updated Vital Signs BP (!) 198/92 (BP Location: Left Arm)   Pulse 73   Temp 98.4 F (36.9 C) (Oral)   Resp 20   SpO2 100%   Physical Exam  Constitutional: She appears well-developed and well-nourished. No distress.  HENT:  Head: Normocephalic and atraumatic.  Eyes: EOM are normal.  Neck: Neck supple.  Cardiovascular: Normal rate.  Pulmonary/Chest: Effort normal.  Abdominal: Soft. Bowel sounds are normal. There is no tenderness.  Musculoskeletal: Normal range of motion.  Neurological: She is alert.  Skin: Skin is warm and dry.  Psychiatric: She has a normal mood and affect. Her behavior is normal.  Nursing note and vitals reviewed.    ED Treatments / Results  Labs (all labs ordered are listed, but only abnormal results are displayed) Labs Reviewed  I-STAT CHEM 8, ED - Abnormal; Notable for the following components:      Result Value  Potassium 2.9 (*)    Creatinine, Ser 1.10 (*)    All other components within normal limits   Radiology No results found.  Procedures Procedures (including critical care time)  Medications Ordered in ED Medications  potassium chloride SA (K-DUR,KLOR-CON) CR tablet 20 mEq (20 mEq Oral Given 02/01/18 1446)     Initial Impression / Assessment and Plan / ED Course  I have reviewed the triage vital signs and the nursing notes. 57 y.o. female with hx of n/v/d yesterday that has resolved. Patient mildly hypokalemic. Dose of K-Dur given in ED and discussed with the patient dehydration and food rich in potassium. Patient to f/u with PCP as needed. Return precautions. Patient stable for d/c.  Final Clinical Impressions(s) / ED Diagnoses   Final  diagnoses:  Hypokalemia    ED Discharge Orders    None       Kerrie Buffalo [place], [place] 02/01/18 2201    Azalia Bilis, MD 02/02/18 [phone]

## 2018-02-24 ENCOUNTER — Other Ambulatory Visit: Payer: Self-pay | Admitting: Unknown Physician Specialty

## 2018-02-24 DIAGNOSIS — Z1231 Encounter for screening mammogram for malignant neoplasm of breast: Secondary | ICD-10-CM

## 2018-04-14 LAB — GLUCOSE, POCT (MANUAL RESULT ENTRY): POC Glucose: 105 mg/dl — AB (ref 70–99)

## 2018-04-18 ENCOUNTER — Ambulatory Visit
Admission: RE | Admit: 2018-04-18 | Discharge: 2018-04-18 | Disposition: A | Discharge status: Home / Self Care | Payer: Self-pay | Source: Ambulatory Visit | Attending: Unknown Physician Specialty | Admitting: Unknown Physician Specialty

## 2018-04-18 DIAGNOSIS — Z1231 Encounter for screening mammogram for malignant neoplasm of breast: Secondary | ICD-10-CM

## 2018-06-06 ENCOUNTER — Encounter (HOSPITAL_COMMUNITY): Payer: Self-pay | Admitting: Emergency Medicine

## 2018-06-06 ENCOUNTER — Emergency Department (HOSPITAL_BASED_OUTPATIENT_CLINIC_OR_DEPARTMENT_OTHER): Payer: PRIVATE HEALTH INSURANCE

## 2018-06-06 ENCOUNTER — Emergency Department (HOSPITAL_COMMUNITY)
Admission: EM | Admit: 2018-06-06 | Discharge: 2018-06-06 | Disposition: A | Discharge status: Home / Self Care | Payer: PRIVATE HEALTH INSURANCE | Attending: Emergency Medicine | Admitting: Emergency Medicine

## 2018-06-06 DIAGNOSIS — Z79899 Other long term (current) drug therapy: Secondary | ICD-10-CM | POA: Insufficient documentation

## 2018-06-06 DIAGNOSIS — M79609 Pain in unspecified limb: Secondary | ICD-10-CM

## 2018-06-06 DIAGNOSIS — S80861A Insect bite (nonvenomous), right lower leg, initial encounter: Secondary | ICD-10-CM | POA: Insufficient documentation

## 2018-06-06 DIAGNOSIS — M7989 Other specified soft tissue disorders: Secondary | ICD-10-CM

## 2018-06-06 DIAGNOSIS — Z87891 Personal history of nicotine dependence: Secondary | ICD-10-CM | POA: Insufficient documentation

## 2018-06-06 DIAGNOSIS — L03115 Cellulitis of right lower limb: Secondary | ICD-10-CM | POA: Insufficient documentation

## 2018-06-06 DIAGNOSIS — Y93H2 Activity, gardening and landscaping: Secondary | ICD-10-CM | POA: Insufficient documentation

## 2018-06-06 DIAGNOSIS — Y92007 Garden or yard of unspecified non-institutional (private) residence as the place of occurrence of the external cause: Secondary | ICD-10-CM | POA: Insufficient documentation

## 2018-06-06 DIAGNOSIS — I10 Essential (primary) hypertension: Secondary | ICD-10-CM | POA: Insufficient documentation

## 2018-06-06 DIAGNOSIS — Y999 Unspecified external cause status: Secondary | ICD-10-CM | POA: Insufficient documentation

## 2018-06-06 DIAGNOSIS — W57XXXA Bitten or stung by nonvenomous insect and other nonvenomous arthropods, initial encounter: Secondary | ICD-10-CM | POA: Insufficient documentation

## 2018-06-06 DIAGNOSIS — M79661 Pain in right lower leg: Secondary | ICD-10-CM | POA: Insufficient documentation

## 2018-06-06 MED ORDER — SULFAMETHOXAZOLE-TRIMETHOPRIM 800-160 MG PO TABS: 1.0000 | ORAL_TABLET | Freq: Two times a day (BID) | ORAL | 0 refills | Status: AC

## 2018-06-06 NOTE — ED Notes (Signed)
Pt states that her BP is normally high. States she has BP meds at home and will medicate there. RN has been made aware

## 2018-06-06 NOTE — ED Notes (Signed)
Pt hasnt taken her HTN meds in couple days.

## 2018-06-06 NOTE — Progress Notes (Signed)
*  Preliminary Results* Right lower extremity venous duplex completed. Right lower extremity is negative for deep vein thrombosis. There is no evidence of right Baker's cyst.  06/06/2018 4:05 PM  Aundra Millet Clare Gandy

## 2018-06-06 NOTE — ED Provider Notes (Signed)
[facility]-EMERGENCY DEPT Provider Note   CSN: [id number] Arrival date & time: 06/06/18  1430   History   Chief Complaint Chief Complaint  Patient presents with  . Insect Bite    HPI Shelby Rojas is a 57 y.o. female who presents to the emergency room for evaluation of insect bites.  Patient states she was mowing the lawn yesterday morning and she ran over a yellow jacket nest and sustained for bites to her right lower extremity.  She states her right lower extremity has been painful.  Rates her pain a 5 out of 10.  Pain does not radiate.  Describes her pain as a throbbing.  States she awoke this morning and her right lower extremity was swollen and increased redness.  Has not taking anything for her pain.  Denies fever, chills, red streaking of her leg, chest pain, shortness of breath, blurred vision, abdominal pain, nausea, vomiting, history of travel, sent surgeries, estrogen use, history of clotting disorders. HPI  Past Medical History:  Diagnosis Date  . Anxiety   . Chest pain   . 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 GERD (gastroesophageal reflux disease)   . High cholesterol   . Hypertension   . Obesity   . Panic attacks   . PNA (pneumonia)     Patient Active Problem List   Diagnosis Date Noted  . Chest pain 08/12/2015  . Essential hypertension 05/09/2013    Past Surgical History:  Procedure Laterality Date  . CESAREAN SECTION       OB History   None      Home Medications    Prior to Admission medications   Medication Sig Start Date End Date Taking? Authorizing Provider  amLODipine (NORVASC) 5 MG tablet Take 2 tablets (10 mg total) by mouth daily. 07/26/17   Alvira Monday, MD  ibuprofen (ADVIL,MOTRIN) 200 MG tablet Take 200 mg by mouth every 6 (six) hours as needed for moderate pain.    [provider]  lisinopril (PRINIVIL,ZESTRIL) 10 MG tablet Take 1 tablet (10 mg total) daily by mouth. 08/11/17 11/09/17  Dunn,  Tacey Ruiz, PA-C  sulfamethoxazole-trimethoprim (BACTRIM DS,SEPTRA DS) 800-160 MG tablet Take 1 tablet by mouth 2 (two) times daily for 7 days. 06/06/18 06/13/18  Jakhia Buxton A, PA-C    Family History Family History  Problem Relation Age of Onset  . Breast cancer Maternal Aunt     Social History Social History   Tobacco Use  . Smoking status: Former Games developer  . Smokeless tobacco: Never Used  Substance Use Topics  . Alcohol use: No  . Drug use: No     Allergies   Cyclobenzaprine   Review of Systems Review of Systems Systems are negative unless otherwise stated in the HPI.  Physical Exam Updated Vital Signs BP (!) 181/100 (BP Location: Left Arm)   Pulse 76   Temp 98.8 F (37.1 C) (Oral)   Resp 18   Ht 5\' 8"  (1.727 m)   Wt 91.2 kg   SpO2 99%   BMI 30.56 kg/m   Physical Exam  Constitutional: She appears well-developed and well-nourished. No distress.  HENT:  Head: Atraumatic.  Eyes: Pupils are equal, round, and reactive to light.  Neck: Normal range of motion.  Cardiovascular: Normal rate and intact distal pulses.  Pulmonary/Chest: No respiratory distress.  Abdominal: She exhibits no distension.  Musculoskeletal: Normal range of motion.  Neurological: She is alert.  Skin: Skin is warm and dry.  Edema,  erythema, warmth to the right lower extremity.  4 areas of noted insect bites.  No streaking of the leg.  2 areas of excoriation to the anterior side of the right lower extremity.  No areas of fluctuance or induration.   Psychiatric: She has a normal mood and affect.  Nursing note and vitals reviewed.    ED Treatments / Results  Labs (all labs ordered are listed, but only abnormal results are displayed) Labs Reviewed - No data to display  EKG None  Radiology No results found.  Procedures Procedures (including critical care time)  Medications Ordered in ED Medications - No data to display   Initial Impression / Assessment and Plan / ED Course  I  have reviewed the triage vital signs and the nursing notes 12 past medical history.  Pertinent labs & imaging results that were available during my care of the patient were reviewed by me and considered in my medical decision making (see chart for details).  Patient presents for evaluation of redness and swelling to the right lower extremity.  U/S for DVT negative. Pt is without risk factors for HIV; no recent use of steroids or other immunosuppressive medications;   Pt is without gross abscess for which I&D would be possible.  Area marked and pt encouraged to return if redness begins to streak, extends beyond the markings, and/or fever or nausea/vomiting develop. Pt is alert, oriented, NAD, afebrile, non tachycardic, nonseptic and nontoxic appearing.  Blood pressure is elevated at today's visit at 181/100 however patient states she has not taken her blood pressure medicine in the last 2 days.  Patient does plan on filling her blood pressure medicine today and she does have a follow-up with her primary care in 3 days. Discussed proper management of her blood pressure. Pt to be d/c on oral antibiotics with strict f/u instructions.  Patient and family voice understanding.    Final Clinical Impressions(s) / ED Diagnoses   Final diagnoses:  Cellulitis of right lower extremity  Insect bite of right lower leg, initial encounter    ED Discharge Orders         Ordered    sulfamethoxazole-trimethoprim (BACTRIM DS,SEPTRA DS) 800-160 MG tablet  2 times daily     06/06/18 1629           Davarious Tumbleson A, PA-C 06/06/18 1640    Mancel Bale, MD 06/07/18 0040

## 2018-06-06 NOTE — ED Triage Notes (Signed)
Pt reports was helping her mother work in her yard yesterday and got stung 3 times in right lower leg. Reports pain and erythema around bites.

## 2018-06-06 NOTE — Discharge Instructions (Addendum)
Your evaluated today for insect bites.  Your blood pressure was elevated during today's visit.  Elevated blood pressure can lead to organ damage to your heart, kidneys, eyes.  These follow-up with your primary care provider for your elevated blood pressure.  Return to the ED for any vision changes, headache, chest pain, shortness of breath.  The Doppler of your right lower extremity was negative for blood clots.  I feel the swelling and redness in her leg is due to an infection from the bug bite you sustained.  I have prescribed you an antibiotic.  Please take as prescribed.  Please return to the ED for new or worsening symptoms such as fever, chills, red streaking up your leg, increased pain, warmth to the leg.

## 2019-05-30 ENCOUNTER — Other Ambulatory Visit: Payer: Self-pay | Admitting: Unknown Physician Specialty

## 2019-05-31 ENCOUNTER — Telehealth: Payer: Self-pay

## 2019-05-31 NOTE — Telephone Encounter (Signed)
Left message with patient concerning [facility] appointment. Left name and number for patient to call back.

## 2019-06-01 ENCOUNTER — Other Ambulatory Visit (HOSPITAL_COMMUNITY): Payer: Self-pay | Admitting: *Deleted

## 2019-06-01 DIAGNOSIS — Z1231 Encounter for screening mammogram for malignant neoplasm of breast: Secondary | ICD-10-CM

## 2019-06-26 ENCOUNTER — Other Ambulatory Visit (HOSPITAL_COMMUNITY): Payer: Self-pay | Admitting: *Deleted

## 2019-06-26 DIAGNOSIS — R2232 Localized swelling, mass and lump, left upper limb: Secondary | ICD-10-CM

## 2019-07-10 ENCOUNTER — Other Ambulatory Visit (HOSPITAL_COMMUNITY): Payer: Self-pay | Admitting: Obstetrics and Gynecology

## 2019-07-10 ENCOUNTER — Encounter (HOSPITAL_COMMUNITY): Payer: Self-pay

## 2019-07-10 ENCOUNTER — Ambulatory Visit (HOSPITAL_COMMUNITY)
Admission: RE | Admit: 2019-07-10 | Discharge: 2019-07-10 | Disposition: A | Discharge status: Home / Self Care | Payer: Medicaid Other | Source: Ambulatory Visit | Attending: Obstetrics and Gynecology | Admitting: Obstetrics and Gynecology

## 2019-07-10 ENCOUNTER — Ambulatory Visit
Admission: RE | Admit: 2019-07-10 | Discharge: 2019-07-10 | Disposition: A | Discharge status: Home / Self Care | Payer: No Typology Code available for payment source | Source: Ambulatory Visit | Attending: Obstetrics and Gynecology | Admitting: Obstetrics and Gynecology

## 2019-07-10 ENCOUNTER — Other Ambulatory Visit: Payer: Self-pay

## 2019-07-10 DIAGNOSIS — R2232 Localized swelling, mass and lump, left upper limb: Secondary | ICD-10-CM | POA: Insufficient documentation

## 2019-07-10 DIAGNOSIS — Z01419 Encounter for gynecological examination (general) (routine) without abnormal findings: Secondary | ICD-10-CM | POA: Insufficient documentation

## 2019-07-10 DIAGNOSIS — N898 Other specified noninflammatory disorders of vagina: Secondary | ICD-10-CM | POA: Insufficient documentation

## 2019-07-10 NOTE — Progress Notes (Signed)
Complaints of mobile left axillary lump x 2 months.   Pap Smear: Pap smear completed today. Last Pap smear was 12 years ago and normal per patient. Per patient has a history of an abnormal Pap smear 23 years ago that a colposcopy was completed for follow-up. Per patient has had at least three normal Pap smears since colposcopy. No Pap smear results are in Epic.  Physical exam: Breasts Breasts symmetrical. No skin abnormalities bilateral breasts. No nipple retraction bilateral breasts. No nipple discharge bilateral breasts. No lymphadenopathy. No lumps palpated right breast. Palpated a pea sized lump within the left axilla at 2 o'clock 13 cm from the nipple. No complaints of pain or tenderness on exam. Referred patient to the [facility] for a diagnostic mammogram and left breast ultrasound. Appointment scheduled for Tuesday, July 10, 2019 at 0920.        Pelvic/Bimanual   Ext Genitalia No lesions, no swelling and no discharge observed on external genitalia.         Vagina Vagina pink and normal texture. No lesions and thick white discharge observed. Wet prep completed.      Cervix Cervix is present. Cervix pink and of normal texture. Thick white discharge observed on cervical os.    Uterus Uterus is present and palpable. Uterus in normal position and normal size.        Adnexae Bilateral ovaries present and palpable. No tenderness on palpation.         Rectovaginal No rectal exam completed today since patient had no rectal complaints. No skin abnormalities observed on exam.    Smoking History: Patient is a former smoker. Patient has a history of smoking for 15-years a half a pack a day.  Patient Navigation: Patient education provided. Access to services provided for patient through [facility] program.   Colorectal Cancer Screening: Per patient has never had a colonoscopy completed. No complaints today.   Breast and Cervical Cancer Risk Assessment: Patient has a  family history of two maternal aunts having breast cancer. Patient has no known genetic mutations or history of radiation treatment to the chest before age 32. Per patient has a history of cervical dysplasia. Patient has no history of being immunocompromised or DES exposure in-utero.  Risk Assessment    Risk Scores      07/10/2019   Last edited by: Loletta Parish, RN   5-year risk: 1.1 %   Lifetime risk: 5.5 %

## 2019-07-10 NOTE — Addendum Note (Signed)
Encounter addended by: Loletta Parish, RN on: 07/10/2019 10:57 AM  Actions taken: Order list changed

## 2019-07-10 NOTE — Patient Instructions (Signed)
Explained breast self awareness with Donnetta Hutching. Let patient know [facility] will cover Pap smears and HPV typing every 5 years unless has a history of abnormal Pap smears. Referred patient to the [facility] for a diagnostic mammogram and left breast ultrasound. Appointment scheduled for Tuesday, July 10, 2019 at 0920. Patient aware of appointment and will be there. Let patient know will follow up with her within the next week with results of Pap smear and wet prep by phone. Donnetta Hutching verbalized understanding.  Tomeshia Pizzi, Arvil Chaco, RN 8:46 AM

## 2019-07-10 NOTE — Addendum Note (Signed)
Encounter addended by: Loletta Parish, RN on: 07/10/2019 11:56 AM  Actions taken: Visit diagnoses modified, Problem List modified, Problem List reviewed

## 2019-07-16 ENCOUNTER — Ambulatory Visit: Payer: Medicaid Other

## 2019-07-17 LAB — CYTOLOGY - PAP
Diagnosis: NEGATIVE
High risk HPV: NEGATIVE

## 2019-07-18 ENCOUNTER — Other Ambulatory Visit: Payer: Self-pay | Admitting: Obstetrics and Gynecology

## 2019-07-18 LAB — CERVICOVAGINAL ANCILLARY ONLY
Bacterial Vaginitis (gardnerella): POSITIVE — AB
Candida Glabrata: NEGATIVE
Candida Vaginitis: NEGATIVE
Comment: NEGATIVE
Comment: NEGATIVE
Comment: NEGATIVE
Comment: NEGATIVE
Trichomonas: NEGATIVE

## 2019-07-18 MED ORDER — METRONIDAZOLE 500 MG PO TABS: 500.0000 mg | ORAL_TABLET | Freq: Two times a day (BID) | ORAL | 0 refills | Status: DC

## 2019-07-19 ENCOUNTER — Telehealth (HOSPITAL_COMMUNITY): Payer: Self-pay | Admitting: *Deleted

## 2019-07-19 NOTE — Telephone Encounter (Signed)
Attempted to call patient to let her know her wet prep results and that a prescription was sent to her pharmacy. Patient has been notified on mychart with results. No one answered phone and unable to leave a voicemail.

## 2019-07-25 ENCOUNTER — Telehealth (HOSPITAL_COMMUNITY): Payer: Self-pay | Admitting: *Deleted

## 2019-07-25 NOTE — Telephone Encounter (Signed)
Attempted to call patient to let her know her wet prep and Pap smear results and that a prescription was sent to her pharmacy. Patient has been notified on mychart with results of wet prep. No one answered phone and unable to leave a voicemail due to her voicemail box is full.

## 2019-07-26 ENCOUNTER — Ambulatory Visit (HOSPITAL_COMMUNITY): Payer: Self-pay

## 2019-08-03 ENCOUNTER — Telehealth (HOSPITAL_COMMUNITY): Payer: Self-pay | Admitting: *Deleted

## 2019-08-03 NOTE — Telephone Encounter (Signed)
Attempted to call patient to give her Pap smear results. No one answered the phone. Left voicemail for patient to call me back.

## 2019-08-03 NOTE — Telephone Encounter (Signed)
Patient returned my phone call. Patient stated she received her Pap smear results through [facility]. Explained they were normal with negative HPV and that her next Pap smear is due in 5 years. Let patient know her wet prep showed BV and that a prescription was sent to her pharmacy. Patient stated she did not get that message that was sent to [facility]. Verified patients pharmacy and told patient to check with her pharmacy on the prescription. Explained Flagyl and advised patient to avoid alcohol while taking. Advised patient to call me back if she has any trouble getting her prescription since it was sent a couple weeks ago. Patient verbalized understanding.

## 2019-08-06 ENCOUNTER — Ambulatory Visit: Payer: Medicaid Other

## 2019-10-01 ENCOUNTER — Other Ambulatory Visit: Payer: Self-pay

## 2019-10-01 ENCOUNTER — Inpatient Hospital Stay: Payer: Self-pay | Attending: Obstetrics and Gynecology | Admitting: *Deleted

## 2019-10-01 VITALS — BP 203/112 | Temp 97.7°F | Ht 67.0 in | Wt 204.5 lb

## 2019-10-01 DIAGNOSIS — Z0000 Encounter for general adult medical examination without abnormal findings: Secondary | ICD-10-CM

## 2019-10-01 NOTE — Progress Notes (Signed)
[facility] initial screening     Clinical Measurement:  Height: 67 in Weight: 204.5 lb  Blood Pressure: 216/118  Blood Pressure #2: 203/112 Fasting Labs Drawn Today, will review with patient when they result.   Medical History:  Patient states that she does have a history of high cholesterol and high blood pressure. Patient states that she does not have a history of diabetes.  Medications:  Patient states that she does not take  medication to lower cholesterol, blood pressure and blood sugar. Patient does not take an aspirin a day to help prevent a heart attack or stroke.    Blood pressure, self measurement: Patient states that she does not measure blood pressure from home because she does not have the equipment to do so.   Nutrition: Patient states that on average she eats 4 cups of fruit and 2 cups of vegetables per day. Patient states that she does eat fish at least 2 times per week. Patient eats less than half servings of whole grains. Patient does not drink less than 36 ounces of beverages with added sugar weekly. Patient is currently watching sodium or salt intake. In the past 7 days patient has not had any drinks containing alcohol. On average patient does not drink any drinks containing alcohol.      Physical activity:  Patient states that she gets 0 minutes of moderate and 0 minutes of vigorous physical activity each week.  Smoking status:  Patient states that she has quit smoking tobacco more than 12 months ago.   Quality of life:  Over the past 2 weeks patient states that she has not had any days where she has little interest or pleasure in doing things and several days where she has felt down, depressed or hopeless.    Risk reduction and counseling:    [facility] Wise: Explained the [facility] Wise program to patient. Patient stated that she would like to participate. Patient signed consent form for participation. Gave patient BP monitor and BP logs to take home. Explained in detail how to take  blood pressure from home as well as gave educational materials to take home to read. Patient was given the opportunity to practice taking blood pressure and logging it on tracking sheet. Provided education about hypertension and answered any questions that patient had. Stressed the importance of getting blood pressure under control at this point so that it does not lead to a stroke in the future. Encouraged patient to go to Urgent Care or the ED once she left to get evaluated considering how high her readings were while in the office. Referral sent to Internal Medicine for follow-up with PCP, waiting to hear back with appointment information. Will call and check in with patient tomorrow. Explained warning signs of stroke and heart attack and informed patient if she begins experiencing any of these symptoms to go to hospital to be evaluated.    Navigation:  I will notify patient of lab results. Patient is aware of 2 more health coaching sessions and a follow up. Will call the patient with follow-up appointment information for Internal Medicine once appointment is scheduled. Will call and check in with patient tomorrow.   Time: 60 minutes

## 2019-10-02 ENCOUNTER — Encounter: Payer: Self-pay | Admitting: Internal Medicine

## 2019-10-02 ENCOUNTER — Ambulatory Visit (INDEPENDENT_AMBULATORY_CARE_PROVIDER_SITE_OTHER): Payer: Self-pay | Admitting: Internal Medicine

## 2019-10-02 VITALS — BP 177/100 | HR 87 | Temp 98.4°F | Ht 67.0 in

## 2019-10-02 DIAGNOSIS — R7309 Other abnormal glucose: Secondary | ICD-10-CM

## 2019-10-02 DIAGNOSIS — E785 Hyperlipidemia, unspecified: Secondary | ICD-10-CM

## 2019-10-02 DIAGNOSIS — R011 Cardiac murmur, unspecified: Secondary | ICD-10-CM

## 2019-10-02 DIAGNOSIS — I10 Essential (primary) hypertension: Secondary | ICD-10-CM

## 2019-10-02 DIAGNOSIS — Z79899 Other long term (current) drug therapy: Secondary | ICD-10-CM

## 2019-10-02 MED ORDER — LOSARTAN POTASSIUM 25 MG PO TABS: 25.0000 mg | ORAL_TABLET | Freq: Every day | ORAL | 0 refills | Status: DC

## 2019-10-02 MED ORDER — AMLODIPINE BESYLATE 10 MG PO TABS: 10.0000 mg | ORAL_TABLET | Freq: Every day | ORAL | 1 refills | Status: DC

## 2019-10-02 MED FILL — LOSARTAN POTASSIUM 25 MG TA: 25 | 30 days supply | Qty: 30 | Fill #0

## 2019-10-02 MED FILL — AMLODIPINE BESYLATE 10 MG T: 10 | 30 days supply | Qty: 30 | Fill #0

## 2019-10-02 NOTE — Progress Notes (Signed)
New Patient Office Visit  Subjective:  Patient ID: Shelby Rojas, female    DOB: [birth year]  Age: 58 y.o. MRN: [id number]  CC: establish care, HTN, HLD  HPI Shelby Rojas presents for establishing care and treatment of hypertension, hyperlipidemia, screening for diabetes  Of note: pt left before visit was over due to a family emergency without checkout sheet, pharmacy information or getting lab work done.    Past Medical History:  Diagnosis Date  . Anxiety   . Chest pain   . Cocaine use disorder ([place])   . 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 GERD (gastroesophageal reflux disease)   . High cholesterol   . Hypertension   . Obesity   . Panic attacks   . PNA (pneumonia)     Past Surgical History:  Procedure Laterality Date  . CESAREAN SECTION      Family History  Problem Relation Age of Onset  . Breast cancer Maternal Aunt   . Arthritis Mother   . Prostate cancer Father   . Hypertension Father     Social History   Socioeconomic History  . Marital status: Single    Spouse name: Not on file  . Number of children: Not on file  . Years of education: Not on file  . Highest education level: Some college, no degree  Occupational History  . Not on file  Tobacco Use  . Smoking status: Former Research scientist (life sciences)  . Smokeless tobacco: Never Used  Substance and Sexual Activity  . Alcohol use: No  . Drug use: No  . Sexual activity: Not Currently    Birth control/protection: None  Other Topics Concern  . Not on file  Social History Narrative  . Not on file   Social Determinants of Health   Financial Resource Strain:   . Difficulty of Paying Living Expenses: Not on file  Food Insecurity:   . Worried About Charity fundraiser in the Last Year: Not on file  . Ran Out of Food in the Last Year: Not on file  Transportation Needs: No Transportation Needs  . Lack of Transportation (Medical): No  . Lack of Transportation (Non-Medical): No  Physical Activity:   .  Days of Exercise per Week: Not on file  . Minutes of Exercise per Session: Not on file  Stress:   . Feeling of Stress : Not on file  Social Connections:   . Frequency of Communication with Friends and Family: Not on file  . Frequency of Social Gatherings with Friends and Family: Not on file  . Attends Religious Services: Not on file  . Active Member of Clubs or Organizations: Not on file  . Attends Archivist Meetings: Not on file  . Marital Status: Not on file  Intimate Partner Violence:   . Fear of Current or Ex-Partner: Not on file  . Emotionally Abused: Not on file  . Physically Abused: Not on file  . Sexually Abused: Not on file  quit smoking four years ago  ROS Review of Systems  Constitutional: Negative for fatigue, fever and unexpected weight change.  HENT: Negative for tinnitus, trouble swallowing and voice change.   Eyes: Negative for discharge and itching.  Respiratory: Negative for cough, choking and chest tightness.   Cardiovascular: Negative for chest pain and leg swelling.  Gastrointestinal: Negative for abdominal distention and abdominal pain.  Endocrine: Negative for cold intolerance and heat intolerance.  Genitourinary: Negative for difficulty urinating and dysuria.  Musculoskeletal: Negative for  arthralgias and back pain.  Skin: Negative for color change.  Allergic/Immunologic: Negative for immunocompromised state.  Neurological: Negative for dizziness and facial asymmetry.  Hematological: Negative for adenopathy.  Psychiatric/Behavioral: Negative for agitation and behavioral problems.    Objective:   Today's Vitals: BP (!) 177/100 (BP Location: Left Arm, Patient Position: Sitting, Cuff Size: Normal)   Pulse 87   Temp 98.4 F (36.9 C) (Oral)   Ht 5\' 7"  (1.702 m)   SpO2 100%   BMI 32.03 kg/m   Physical Exam Constitutional:      General: She is not in acute distress.    Appearance: She is not diaphoretic.  HENT:     Head: Normocephalic  and atraumatic.  Cardiovascular:     Rate and Rhythm: Normal rate and regular rhythm.     Heart sounds: Murmur present. Systolic murmur present. No friction rub. No gallop.   Pulmonary:     Effort: Pulmonary effort is normal. No respiratory distress.     Breath sounds: Normal breath sounds. No wheezing or rales.  Chest:     Chest wall: No tenderness.  Abdominal:     General: Bowel sounds are normal. There is no distension.     Palpations: Abdomen is soft. There is no mass.     Tenderness: There is no abdominal tenderness. There is no guarding or rebound.  Musculoskeletal:     Right lower leg: No edema.     Left lower leg: No edema.  Neurological:     Mental Status: She is alert. Mental status is at baseline.  Psychiatric:        Attention and Perception: Attention normal.        Mood and Affect: Mood is anxious.     Assessment & Plan:   Problem List Items Addressed This Visit      Cardiovascular and Mediastinum   Essential hypertension - Primary   Relevant Medications   amLODipine (NORVASC) 10 MG tablet   losartan (COZAAR) 25 MG tablet   Other Relevant Orders   BMP8+Anion Gap    Other Visit Diagnoses    Hyperlipidemia, unspecified hyperlipidemia type       Relevant Medications   amLODipine (NORVASC) 10 MG tablet   losartan (COZAAR) 25 MG tablet   Other Relevant Orders   Lipid Profile   Elevated random blood glucose level       Relevant Orders   Hemoglobin A1c      Outpatient Encounter Medications as of 10/02/2019  Medication Sig  . amLODipine (NORVASC) 10 MG tablet Take 1 tablet (10 mg total) by mouth daily.  10/04/2019 ibuprofen (ADVIL,MOTRIN) 200 MG tablet Take 200 mg by mouth every 6 (six) hours as needed for moderate pain.  Marland Kitchen losartan (COZAAR) 25 MG tablet Take 1 tablet (25 mg total) by mouth daily.  . metroNIDAZOLE (FLAGYL) 500 MG tablet Take 1 tablet (500 mg total) by mouth 2 (two) times daily. (Patient not taking: Reported on 10/01/2019)  . [DISCONTINUED]  amLODipine (NORVASC) 5 MG tablet Take 2 tablets (10 mg total) by mouth daily. (Patient not taking: Reported on 10/01/2019)  . [DISCONTINUED] lisinopril (PRINIVIL,ZESTRIL) 10 MG tablet Take 1 tablet (10 mg total) daily by mouth.   No facility-administered encounter medications on file as of 10/02/2019.    Follow-up: No follow-ups on file.   10/04/2019, MD

## 2019-10-03 ENCOUNTER — Telehealth: Payer: Self-pay

## 2019-10-03 ENCOUNTER — Other Ambulatory Visit: Payer: Self-pay | Admitting: Obstetrics and Gynecology

## 2019-10-03 ENCOUNTER — Encounter: Payer: Self-pay | Admitting: Internal Medicine

## 2019-10-03 DIAGNOSIS — R7309 Other abnormal glucose: Secondary | ICD-10-CM | POA: Insufficient documentation

## 2019-10-03 DIAGNOSIS — E785 Hyperlipidemia, unspecified: Secondary | ICD-10-CM | POA: Insufficient documentation

## 2019-10-03 NOTE — Assessment & Plan Note (Signed)
Will screen for diabetes with A1C given obesity and historical elevation of fasting glucose.

## 2019-10-03 NOTE — Telephone Encounter (Signed)
rtc to pt, lm for rtc, will schedule lab appt when she calls back

## 2019-10-03 NOTE — Telephone Encounter (Signed)
Pt have question regarding question medicine, [phone].Marland Kitchen

## 2019-10-03 NOTE — Assessment & Plan Note (Addendum)
History of HTN, says she was on medicare family plan in the past but no longer qualifies, I believe she means medicaid family plan.  She was referred by [facility] program.  She is hypertensive today, she has been on amlodipine and lisinopril in the past but reports the lisinopril would make her nauseous.  Last renal function wnl, she has a history of hypokalemia.    -start pt on amlodipine 10mg , losartan 25mg  -recheck bmp -arrange for contact and follow up appointment and labs placed as future as pt left before visit was over due to a family emergency

## 2019-10-03 NOTE — Assessment & Plan Note (Signed)
History of HLD, not currently on any cholesterol lowering medications.    -check lipid profile

## 2019-10-03 NOTE — Progress Notes (Signed)
Internal Medicine Clinic Attending  Case discussed with Dr. Winfrey  at the time of the visit.  We reviewed the resident's history and exam and pertinent patient test results.  I agree with the assessment, diagnosis, and plan of care documented in the resident's note.  

## 2019-10-03 NOTE — Telephone Encounter (Signed)
That is fine, I'm not sure why they said that.  She left before getting labs so I have placed them as future orders.  If someone could ask her to get her lab work done as soon as possible that would be great.

## 2019-10-03 NOTE — Telephone Encounter (Signed)
rtc to pt, she stated her pharmacy suggested that she not take both BP meds at the same time, she is advised she can take 1 in am and 1 at pm. Made appt for f/u 11/01/2019 at 1500 [facility]. Is this acceptable?

## 2019-10-04 LAB — LIPID PANEL W/O CHOL/HDL RATIO
Cholesterol, Total: 269 mg/dL — ABNORMAL HIGH (ref 100–199)
HDL: 39 mg/dL — ABNORMAL LOW (ref >39)
LDL Chol Calc (NIH): 196 mg/dL — ABNORMAL HIGH (ref 0–99)
Triglycerides: 181 mg/dL — ABNORMAL HIGH (ref 0–149)
VLDL Cholesterol Cal: 34 mg/dL (ref 5–40)

## 2019-10-04 LAB — GLUCOSE, RANDOM: Glucose: 125 mg/dL — ABNORMAL HIGH (ref 65–99)

## 2019-10-04 LAB — HGB A1C W/O EAG: Hgb A1c MFr Bld: 6.4 % — ABNORMAL HIGH (ref 4.8–5.6)

## 2019-10-08 ENCOUNTER — Telehealth: Payer: Self-pay

## 2019-10-08 NOTE — Telephone Encounter (Signed)
Called patient to go over lab results from [facility]. Patient stated that she was not at home with her worksheet. Patient asked for me to call her back later today or tomorrow morning.

## 2019-10-08 NOTE — Telephone Encounter (Signed)
Spoke with the patient and she is sch to come in tomorrow 10/09/2019 for her labs.

## 2019-10-09 ENCOUNTER — Other Ambulatory Visit: Payer: Self-pay

## 2019-10-09 ENCOUNTER — Other Ambulatory Visit: Payer: No Typology Code available for payment source

## 2019-10-09 ENCOUNTER — Telehealth: Payer: Self-pay

## 2019-10-09 NOTE — Telephone Encounter (Signed)
Health coaching 2     Labs- 269 cholesterol , 196 LDL cholesterol , 181 triglycerides , 39 HDL cholesterol , 6.4 hemoglobin A1C , 125 mean plasma glucose  Patient understands and is aware of her lab results.   Goals-  Discussed in detail lab results with patient. Answered any questions that the patient had regarding results.   Goals- Reduce the amount of sweets, sugars and carbs consumed. Reduced the amount of fried and fatty foods consumed. Reduce the amount of red meat and whole fat dairy foods consumed. Encouraged patient to try and grill, bake, sautee or broil foods instead.   Heart Wise: Checked in with patient to see how her blood pressure tracking has been going. Patient stated that she has been taking her readings in the morning and evening. Will check in with patient next Wednesday 1/13 to get her first two weeks of readings.    Navigation:  Patient is aware of 1 more health coaching session and a follow up. Patient is scheduled for follow-up with Internal Medicine on 11/01/2019 @ 3:15 pm.  Time- 12 minutes

## 2019-10-30 ENCOUNTER — Telehealth: Payer: Self-pay

## 2019-10-30 ENCOUNTER — Telehealth (HOSPITAL_COMMUNITY): Payer: Self-pay

## 2019-10-30 NOTE — Telephone Encounter (Signed)
rtc to pt, lm for rtc 

## 2019-10-30 NOTE — Telephone Encounter (Signed)
Pt asking what she need to do before coming to her appt on 11/05/19. Then she stated she will be looking for a new doctor; I asked was she still coming to this appt since she will be looking for a new doctor- she stated yes. Stated she di not received any check out paper when asked.Stated we were too confusing ( per doctor's note she declined AVS so I tell her f/u recommendations). Stated she had an emergency and had to leave. Before I had a chance to read the encounter, she stated she was going back to her old doctor and to cancel the appt on 2/1 and "Have a good day".

## 2019-10-30 NOTE — Telephone Encounter (Signed)
Called patient to get Heart Wise BP readings for [employer]. Gave the patient more tips and information about checking and monitoring blood pressure from home. Encouraged patient to try and take the readings at the same time everyday when the patient first wakes up and in the evenings when the patient is ready to go to bed. Patient has also been taking her BP meds at different times everyday. Encouraged patient to try and take her medication every day and at the same time everyday. Patient stated that she only has 8 pills left, encouraged her to call in her refill so she will have it when she runs out. Patient voiced understanding and stated that she will call me back if she has any questions.

## 2019-10-30 NOTE — Telephone Encounter (Signed)
Pt would like a nurse to call back regarding the f/u for 02/01; pt contact [phone]

## 2019-11-01 ENCOUNTER — Ambulatory Visit: Payer: No Typology Code available for payment source

## 2019-11-05 ENCOUNTER — Ambulatory Visit: Payer: No Typology Code available for payment source

## 2019-11-14 MED FILL — AMLODIPINE BESYLATE 10 MG T: 10 | 30 days supply | Qty: 30 | Fill #1

## 2019-12-27 ENCOUNTER — Ambulatory Visit: Payer: Medicaid Other | Attending: Internal Medicine

## 2019-12-27 DIAGNOSIS — Z23 Encounter for immunization: Secondary | ICD-10-CM

## 2019-12-27 NOTE — Progress Notes (Signed)
   Covid-19 Vaccination Clinic  Name:  Shelby Rojas    MRN: [id number] DOB: 02-26-1961  12/27/2019  [name] was observed post Covid-19 immunization for 30 minutes based on pre-vaccination screening without incident. She was provided with Vaccine Information Sheet and instruction to access the V-Safe system.   [name] was instructed to call 911 with any severe reactions post vaccine: Marland Kitchen Difficulty breathing  . Swelling of face and throat  . A fast heartbeat  . A bad rash all over body  . Dizziness and weakness   Immunizations Administered    Name Date Dose VIS Date Route   Pfizer COVID-19 Vaccine 12/27/2019  9:56 AM 0.3 mL 09/14/2019 Intramuscular   Manufacturer: [employer], [employer]   Lot: [id number]   NDC: [address]-4

## 2020-01-17 ENCOUNTER — Other Ambulatory Visit: Payer: Self-pay | Admitting: Internal Medicine

## 2020-01-17 DIAGNOSIS — I10 Essential (primary) hypertension: Secondary | ICD-10-CM

## 2020-01-21 ENCOUNTER — Ambulatory Visit: Payer: No Typology Code available for payment source

## 2020-01-22 ENCOUNTER — Ambulatory Visit: Payer: Medicaid Other | Attending: Internal Medicine

## 2020-01-22 DIAGNOSIS — Z23 Encounter for immunization: Secondary | ICD-10-CM

## 2020-01-22 NOTE — Progress Notes (Signed)
   Covid-19 Vaccination Clinic  Name:  MELLANY DINSMORE    MRN: [id number] DOB: Sep 05, 1961  01/22/2020  Ms. Bonillas was observed post Covid-19 immunization for 15 minutes without incident. She was provided with Vaccine Information Sheet and instruction to access the V-Safe system.   Ms. Wagman was instructed to call 911 with any severe reactions post vaccine: Marland Kitchen Difficulty breathing  . Swelling of face and throat  . A fast heartbeat  . A bad rash all over body  . Dizziness and weakness   Immunizations Administered    Name Date Dose VIS Date Route   Pfizer COVID-19 Vaccine 01/22/2020  9:48 AM 0.3 mL 11/28/2018 Intramuscular   Manufacturer: [employer], [employer]   Lot: [id number]   NDC: [address]-9

## 2020-01-24 ENCOUNTER — Telehealth: Payer: Self-pay

## 2020-01-24 NOTE — Telephone Encounter (Signed)
Health Coaching 3    Goals- Reducing the amount of beverages with added sugars consumed (sodas). Adding more whole grains (oatmeal, whole wheat bread, brown pasta, brown rice) into daily diet. Watching the amount of fried and fatty foods consumed.    New goal- Patient had been taking her blood pressure readings for the Heart Wise program. Patient took her 1st 2 weeks of daily am and pm blood pressure readings Patient completed her 1st week of weekly readings. Patient needs to complete her remaining weekly readings for the next 7 weeks.        Navigation:  Patient is aware of  a follow up session. Patient stated that she is almost out of her blood pressure medication but does not want to go back to Internal Medicine. Contacted the [facility] to get scheduled for a visit to get established with their office. Patient is scheduled for Wednesday, April 28th @ 2:50 pm.  Patient is scheduled for [facility] follow-up visit on March 17, 2020 @ 10:00 am.   Time- 13 minutes

## 2020-01-30 ENCOUNTER — Other Ambulatory Visit: Payer: Self-pay

## 2020-01-30 ENCOUNTER — Ambulatory Visit: Payer: Medicaid Other | Attending: Nurse Practitioner | Admitting: Nurse Practitioner

## 2020-01-30 ENCOUNTER — Encounter: Payer: Self-pay | Admitting: Nurse Practitioner

## 2020-01-30 DIAGNOSIS — Z130 Encounter for screening for diseases of the blood and blood-forming organs and certain disorders involving the immune mechanism: Secondary | ICD-10-CM

## 2020-01-30 DIAGNOSIS — Z114 Encounter for screening for human immunodeficiency virus [HIV]: Secondary | ICD-10-CM

## 2020-01-30 DIAGNOSIS — Z7689 Persons encountering health services in other specified circumstances: Secondary | ICD-10-CM

## 2020-01-30 DIAGNOSIS — Z1159 Encounter for screening for other viral diseases: Secondary | ICD-10-CM

## 2020-01-30 DIAGNOSIS — E782 Mixed hyperlipidemia: Secondary | ICD-10-CM

## 2020-01-30 DIAGNOSIS — I10 Essential (primary) hypertension: Secondary | ICD-10-CM

## 2020-01-30 MED ORDER — LOSARTAN POTASSIUM 25 MG PO TABS: 25.0000 mg | ORAL_TABLET | Freq: Every day | ORAL | 1 refills | Status: DC

## 2020-01-30 MED ORDER — AMLODIPINE BESYLATE 10 MG PO TABS: 10.0000 mg | ORAL_TABLET | Freq: Every day | ORAL | 1 refills | Status: DC

## 2020-01-30 MED ORDER — ATORVASTATIN CALCIUM 20 MG PO TABS: 20.0000 mg | ORAL_TABLET | Freq: Every day | ORAL | 3 refills | Status: DC

## 2020-01-30 MED FILL — AMLODIPINE BESYLATE 10 MG T: 10 | 90 days supply | Qty: 90 | Fill #0

## 2020-01-30 MED FILL — LOSARTAN POTASSIUM 25 MG TA: 25 | 90 days supply | Qty: 90 | Fill #0

## 2020-01-30 MED FILL — ATORVASTATIN 20 MG TABLET: 20 | 90 days supply | Qty: 90 | Fill #0

## 2020-01-30 NOTE — Progress Notes (Signed)
Virtual Visit via Telephone Note Due to national recommendations of social distancing due to COVID 19, telehealth visit is felt to be most appropriate for this patient at this time.  I discussed the limitations, risks, security and privacy concerns of performing an evaluation and management service by telephone and the availability of in person appointments. I also discussed with the patient that there may be a patient responsible charge related to this service. The patient expressed understanding and agreed to proceed.    I connected with Shelby Rojas on 01/30/20  at   2:50 PM EDT  EDT by telephone and verified that I am speaking with the correct person using two identifiers.   Consent I discussed the limitations, risks, security and privacy concerns of performing an evaluation and management service by telephone and the availability of in person appointments. I also discussed with the patient that there may be a patient responsible charge related to this service. The patient expressed understanding and agreed to proceed.   Location of Patient: Private Residence    [facility] of Provider: [facility]    Persons participating in Telemedicine visit: Bertram Denver FNP-BC YY Millry CMA Shelby Rojas    History of Present Illness: Telemedicine visit for: Establish Care  has a past medical history of Anxiety, Chest pain, Cocaine use disorder (H[facility]), Diastolic dysfunction without heart failure, GERD (gastroesophageal reflux disease), High cholesterol, Hypertension, Obesity, Panic attacks, PNA (pneumonia), and Prediabetes.   Health Maintenance Referred for mammogram today  Essential Hypertension Taking norvasc 10 mg and losartan 25 mg. Last recorded reading she can recall through the [facility] program was 118/92. Denies chest pain, shortness of breath, palpitations, lightheadedness, dizziness, headaches or BLE edema.  BP Readings from Last 3 Encounters:   10/02/19 (!) 177/100  10/01/19 (!) 203/112  07/10/19 (!) 140/95     The 10-year ASCVD risk score Denman George DC Montez Hageman., et al., 2013) is: 25.8%   Values used to calculate the score:     Age: 59 years     Sex: Female     Is Non-Hispanic African American: Yes     Diabetic: No     Tobacco smoker: No     Systolic Blood Pressure: 177 mmHg     Is BP treated: Yes     HDL Cholesterol: 39 mg/dL     Total Cholesterol: 269 mg/dL  Will add atorvastatin 20 mg daily today. She denies any previous statin intolerance.   Prediabetes Currently diet controlled.  Lab Results  Component Value Date   HGBA1C 6.4 (H) 10/03/2019   Past Medical History:  Diagnosis Date  . Anxiety   . Chest pain   . Cocaine use disorder (HCC)   . 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 GERD (gastroesophageal reflux disease)   . High cholesterol   . Hypertension   . Obesity   . Panic attacks   . PNA (pneumonia)   . Prediabetes     Past Surgical History:  Procedure Laterality Date  . CESAREAN SECTION      Family History  Problem Relation Age of Onset  . Breast cancer Maternal Aunt   . Arthritis Mother   . Prostate cancer Father   . Hypertension Father     Social History   Socioeconomic History  . Marital status: Single    Spouse name: Not on file  . Number of children: Not on file  . Years of education: Not on file  . Highest education  level: Some college, no degree  Occupational History  . Not on file  Tobacco Use  . Smoking status: Never Smoker  . Smokeless tobacco: Never Used  Substance and Sexual Activity  . Alcohol use: No  . Drug use: No  . Sexual activity: Not Currently    Birth control/protection: None  Other Topics Concern  . Not on file  Social History Narrative  . Not on file   Social Determinants of Health   Financial Resource Strain:   . Difficulty of Paying Living Expenses:   Food Insecurity:   . Worried About Programme researcher, broadcasting/film/video in the Last Year:   . Barista  in the Last Year:   Transportation Needs: No Transportation Needs  . Lack of Transportation (Medical): No  . Lack of Transportation (Non-Medical): No  Physical Activity:   . Days of Exercise per Week:   . Minutes of Exercise per Session:   Stress:   . Feeling of Stress :   Social Connections:   . Frequency of Communication with Friends and Family:   . Frequency of Social Gatherings with Friends and Family:   . Attends Religious Services:   . Active Member of Clubs or Organizations:   . Attends Banker Meetings:   Marland Kitchen Marital Status:      Observations/Objective: Awake, alert and oriented x 3   Review of Systems  Constitutional: Negative for fever, malaise/fatigue and weight loss.  HENT: Negative.  Negative for nosebleeds.   Eyes: Negative.  Negative for blurred vision, double vision and photophobia.  Respiratory: Negative.  Negative for cough and shortness of breath.   Cardiovascular: Negative.  Negative for chest pain, palpitations and leg swelling.  Gastrointestinal: Positive for heartburn. Negative for nausea and vomiting.  Musculoskeletal: Negative.  Negative for myalgias.  Neurological: Negative.  Negative for dizziness, focal weakness, seizures and headaches.  Psychiatric/Behavioral: Negative.  Negative for suicidal ideas.    Assessment and Plan: Kayona was seen today for new patient (initial visit).  Diagnoses and all orders for this visit:  Encounter to establish care -     Lipid Panel  Essential hypertension -     Basic Metabolic Panel -     amLODipine (NORVASC) 10 MG tablet; Take 1 tablet (10 mg total) by mouth daily. -     losartan (COZAAR) 25 MG tablet; Take 1 tablet (25 mg total) by mouth daily. Continue all antihypertensives as prescribed.  Remember to bring in your blood pressure log with you for your follow up appointment.  DASH/Mediterranean Diets are healthier choices for HTN.    Mixed hyperlipidemia -     atorvastatin (LIPITOR) 20 MG  tablet; Take 1 tablet (20 mg total) by mouth daily. INSTRUCTIONS: Work on a low fat, heart healthy diet and participate in regular aerobic exercise program by working out at least 150 minutes per week; 5 days a week-30 minutes per day. Avoid red meat/beef/steak,  fried foods. junk foods, sodas, sugary drinks, unhealthy snacking, alcohol and smoking.  Drink at least 80 oz of water per day and monitor your carbohydrate intake daily.    Encounter for screening for human immunodeficiency virus (HIV) -     Vitamin D, 25-hydroxy -     HIV antibody (with reflex)  Need for hepatitis C screening test -     Hepatitis C Antibody  Screening for deficiency anemia -     CBC     Follow Up Instructions Return in about 3 months (around  04/30/2020).     I discussed the assessment and treatment plan with the patient. The patient was provided an opportunity to ask questions and all were answered. The patient agreed with the plan and demonstrated an understanding of the instructions.   The patient was advised to call back or seek an in-person evaluation if the symptoms worsen or if the condition fails to improve as anticipated.  I provided 19 minutes of non-face-to-face time during this encounter including median intraservice time, reviewing previous notes, labs, imaging, medications and explaining diagnosis and management.  Gildardo Pounds, FNP-BC

## 2020-01-31 ENCOUNTER — Encounter: Payer: Self-pay | Admitting: Nurse Practitioner

## 2020-01-31 LAB — CBC
Hematocrit: 45.2 % (ref 34.0–46.6)
Hemoglobin: 14.7 g/dL (ref 11.1–15.9)
MCH: 29.3 pg (ref 26.6–33.0)
MCHC: 32.5 g/dL (ref 31.5–35.7)
MCV: 90 fL (ref 79–97)
Platelets: 261 10*3/uL (ref 150–450)
RBC: 5.01 x10E6/uL (ref 3.77–5.28)
RDW: 12.7 % (ref 11.7–15.4)
WBC: 9.5 10*3/uL (ref 3.4–10.8)

## 2020-01-31 LAB — LIPID PANEL
Chol/HDL Ratio: 7.1 ratio — ABNORMAL HIGH (ref 0.0–4.4)
Cholesterol, Total: 264 mg/dL — ABNORMAL HIGH (ref 100–199)
HDL: 37 mg/dL — ABNORMAL LOW (ref >39)
LDL Chol Calc (NIH): 180 mg/dL — ABNORMAL HIGH (ref 0–99)
Triglycerides: 243 mg/dL — ABNORMAL HIGH (ref 0–149)
VLDL Cholesterol Cal: 47 mg/dL — ABNORMAL HIGH (ref 5–40)

## 2020-01-31 LAB — BASIC METABOLIC PANEL
BUN/Creatinine Ratio: 17 (ref 12–28)
BUN: 15 mg/dL (ref 8–27)
CO2: 24 mmol/L (ref 20–29)
Calcium: 10.1 mg/dL (ref 8.7–10.3)
Chloride: 102 mmol/L (ref 96–106)
Creatinine, Ser: 0.9 mg/dL (ref 0.57–1.00)
GFR calc Af Amer: 80 mL/min/{1.73_m2} (ref >59)
GFR calc non Af Amer: 70 mL/min/{1.73_m2} (ref >59)
Glucose: 81 mg/dL (ref 65–99)
Potassium: 4 mmol/L (ref 3.5–5.2)
Sodium: 144 mmol/L (ref 134–144)

## 2020-01-31 LAB — HEPATITIS C ANTIBODY: Hep C Virus Ab: <0.1 s/co ratio (ref 0.0–0.9)

## 2020-01-31 LAB — HIV ANTIBODY (ROUTINE TESTING W REFLEX): HIV Screen 4th Generation wRfx: NONREACTIVE

## 2020-01-31 LAB — VITAMIN D 25 HYDROXY (VIT D DEFICIENCY, FRACTURES): Vit D, 25-Hydroxy: 12.6 ng/mL — ABNORMAL LOW (ref 30.0–100.0)

## 2020-02-03 ENCOUNTER — Other Ambulatory Visit: Payer: Self-pay | Admitting: Nurse Practitioner

## 2020-02-03 MED ORDER — VITAMIN D (ERGOCALCIFEROL) 1.25 MG (50000 UNIT) PO CAPS: 50000.0000 [IU] | ORAL_CAPSULE | ORAL | 1 refills | Status: DC

## 2020-02-03 MED ORDER — VITAMIN D (ERGOCALCIFEROL) 1.25 MG (50000 UNIT) PO CAPS
50000.0000 [IU] | ORAL_CAPSULE | ORAL | 1 refills | Status: DC
Start: 2020-02-03 — End: 2020-02-03

## 2020-02-04 MED FILL — VIT D2 1.25 MG (50,000 UNIT: 1.25 MG | 84 days supply | Qty: 12 | Fill #0

## 2020-02-25 ENCOUNTER — Other Ambulatory Visit: Payer: Self-pay

## 2020-02-25 ENCOUNTER — Ambulatory Visit: Payer: Self-pay | Attending: Nurse Practitioner | Admitting: Nurse Practitioner

## 2020-02-25 ENCOUNTER — Encounter: Payer: Self-pay | Admitting: Nurse Practitioner

## 2020-02-25 VITALS — BP 171/97 | HR 68 | Temp 97.7°F | Ht 67.0 in | Wt 201.0 lb

## 2020-02-25 DIAGNOSIS — E669 Obesity, unspecified: Secondary | ICD-10-CM | POA: Insufficient documentation

## 2020-02-25 DIAGNOSIS — F410 Panic disorder [episodic paroxysmal anxiety] without agoraphobia: Secondary | ICD-10-CM | POA: Insufficient documentation

## 2020-02-25 DIAGNOSIS — R7303 Prediabetes: Secondary | ICD-10-CM

## 2020-02-25 DIAGNOSIS — Z7901 Long term (current) use of anticoagulants: Secondary | ICD-10-CM | POA: Insufficient documentation

## 2020-02-25 DIAGNOSIS — Z79899 Other long term (current) drug therapy: Secondary | ICD-10-CM | POA: Insufficient documentation

## 2020-02-25 DIAGNOSIS — Z8249 Family history of ischemic heart disease and other diseases of the circulatory system: Secondary | ICD-10-CM | POA: Insufficient documentation

## 2020-02-25 DIAGNOSIS — I10 Essential (primary) hypertension: Secondary | ICD-10-CM

## 2020-02-25 DIAGNOSIS — Z6831 Body mass index (BMI) 31.0-31.9, adult: Secondary | ICD-10-CM | POA: Insufficient documentation

## 2020-02-25 DIAGNOSIS — E782 Mixed hyperlipidemia: Secondary | ICD-10-CM

## 2020-02-25 DIAGNOSIS — R195 Other fecal abnormalities: Secondary | ICD-10-CM | POA: Insufficient documentation

## 2020-02-25 DIAGNOSIS — Z1211 Encounter for screening for malignant neoplasm of colon: Secondary | ICD-10-CM

## 2020-02-25 DIAGNOSIS — M79602 Pain in left arm: Secondary | ICD-10-CM | POA: Insufficient documentation

## 2020-02-25 LAB — GLUCOSE, POCT (MANUAL RESULT ENTRY): POC Glucose: 158 mg/dl — AB (ref 70–99)

## 2020-02-25 MED ORDER — HYDROXYZINE HCL 25 MG PO TABS
25.0000 mg | ORAL_TABLET | Freq: Three times a day (TID) | ORAL | 2 refills | Status: AC | PRN
Start: 2020-02-25 — End: 2020-03-26

## 2020-02-25 MED ORDER — LOSARTAN POTASSIUM 100 MG PO TABS: 100.0000 mg | ORAL_TABLET | Freq: Every day | ORAL | 1 refills | Status: DC

## 2020-02-25 MED ORDER — VITAMIN D (ERGOCALCIFEROL) 1.25 MG (50000 UNIT) PO CAPS: 50000.0000 [IU] | ORAL_CAPSULE | ORAL | 1 refills | Status: DC

## 2020-02-25 MED ORDER — AMLODIPINE BESYLATE 10 MG PO TABS: 10.0000 mg | ORAL_TABLET | Freq: Every day | ORAL | 1 refills | Status: DC

## 2020-02-25 MED ORDER — ATORVASTATIN CALCIUM 20 MG PO TABS: 20.0000 mg | ORAL_TABLET | Freq: Every day | ORAL | 3 refills | Status: DC

## 2020-02-25 MED ORDER — CLONIDINE HCL 0.1 MG PO TABS
0.2000 mg | ORAL_TABLET | Freq: Once | ORAL | Status: AC
  Administered 2020-02-25: 0.2 mg via ORAL

## 2020-02-25 MED FILL — ?ERGOCALCIFEROL 50000 UNITC: 1.25 MG | 28 days supply | Qty: 4 | Fill #0

## 2020-02-25 MED FILL — LOSARTAN POTASSIUM 100 MG T: 100 | 30 days supply | Qty: 30 | Fill #0

## 2020-02-25 MED FILL — ?AMLODIPINE BESYL 10MG TABL: 10 | 30 days supply | Qty: 30 | Fill #0

## 2020-02-25 MED FILL — hydrOXYzine HCL 25 MG TABS: 25 | 20 days supply | Qty: 60 | Fill #0

## 2020-02-25 MED FILL — ?ATORVASTATIN 20 MG TABLET: 20 | 30 days supply | Qty: 30 | Fill #0

## 2020-02-25 NOTE — Progress Notes (Signed)
Assessment & Plan:  Shelby Rojas was seen today for blood pressure check.  Diagnoses and all orders for this visit:  Essential hypertension -     cloNIDine (CATAPRES) tablet 0.2 mg -     Discontinue: amLODipine (NORVASC) 10 MG tablet; Take 1 tablet (10 mg total) by mouth daily. -     Discontinue: losartan (COZAAR) 100 MG tablet; Take 1 tablet (100 mg total) by mouth daily. -     losartan (COZAAR) 100 MG tablet; Take 1 tablet (100 mg total) by mouth daily. -     amLODipine (NORVASC) 10 MG tablet; Take 1 tablet (10 mg total) by mouth daily. Continue all antihypertensives as prescribed.  Remember to bring in your blood pressure log with you for your follow up appointment.  DASH/Mediterranean Diets are healthier choices for HTN.    Prediabetes -     Glucose (CBG) Well controlled with diet at this time.   Colon cancer screening -     Fecal occult blood, imunochemical([facility])  Mixed hyperlipidemia -     atorvastatin (LIPITOR) 20 MG tablet; Take 1 tablet (20 mg total) by mouth daily. LDL not at goal of <100.  Lab Results  Component Value Date   LDLCALC 180 (H) 01/30/2020     Patient has been counseled on age-appropriate routine health concerns for screening and prevention. These are reviewed and up-to-date. Referrals have been placed accordingly. Immunizations are up-to-date or declined.    Subjective:   Chief Complaint  Patient presents with  . Blood Pressure Check    Pt. is here for blood pressure check.    HPI Shelby Rojas 59 y.o. female presents to office today for follow up to HTN.  Essential Hypertension She could not afford her blood pressure medications so has not taken in over a week. I have instructed her to follow up with the pharmacy regarding financial assistance with her medications. Patient has been advised to apply for financial assistance and schedule to see our financial counselor. . She has not re applied for the orange card as of yet. Denies  chest pain, shortness of breath, palpitations, lightheadedness, dizziness, headaches or BLE edema. She does endorse left arm pain which comes and goes. EKG did not show any acute changes.   BP Readings from Last 3 Encounters:  02/25/20 (!) 171/97  10/02/19 (!) 177/100  10/01/19 (!) 203/112    Requesting work note for today. 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 States she had  Panic attack due to the height and was told by her supervisor to get a note from work. Was previously prescribed sertraline 100 mg however declines restarting today. She does have a history of anxiety and panic attacks.       Review of Systems  Constitutional: Negative for fever, malaise/fatigue and weight loss.  HENT: Negative.  Negative for nosebleeds.   Eyes: Negative.  Negative for blurred vision, double vision and photophobia.  Respiratory: Negative.  Negative for cough and shortness of breath.   Cardiovascular: Negative.  Negative for chest pain, palpitations and leg swelling.  Gastrointestinal: Negative.  Negative for heartburn, nausea and vomiting.  Musculoskeletal: Negative for myalgias.       Left arm pain  Neurological: Negative.  Negative for dizziness, focal weakness, seizures and headaches.  Psychiatric/Behavioral: Negative.  Negative for suicidal ideas.    Past Medical History:  Diagnosis Date  . Anxiety   .  Chest pain   . Cocaine use disorder (HCC)   . 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 GERD (gastroesophageal reflux disease)   . High cholesterol   . Hypertension   . Obesity   . Panic attacks   . PNA (pneumonia)   . Prediabetes     Past Surgical History:  Procedure Laterality Date  . CESAREAN SECTION      Family History  Problem Relation Age of Onset  . Breast cancer Maternal Aunt   . Arthritis Mother   . Prostate cancer Father   . Hypertension Father     Social History Reviewed with no  changes to be made today.   Outpatient Medications Prior to Visit  Medication Sig Dispense Refill  . Vitamin D, Ergocalciferol, (DRISDOL) 1.25 MG (50000 UNIT) CAPS capsule Take 1 capsule (50,000 Units total) by mouth every 7 (seven) days. 12 capsule 1  . amLODipine (NORVASC) 10 MG tablet Take 1 tablet (10 mg total) by mouth daily. 90 tablet 1  . atorvastatin (LIPITOR) 20 MG tablet Take 1 tablet (20 mg total) by mouth daily. 90 tablet 3  . losartan (COZAAR) 25 MG tablet Take 1 tablet (25 mg total) by mouth daily. 90 tablet 1   No facility-administered medications prior to visit.    Allergies  Allergen Reactions  . Cyclobenzaprine Anaphylaxis    Pt  States  She  Is  Allergic  To  Muscle  relaxant       Objective:    BP (!) 214/121 (BP Location: Left Arm, Patient Position: Sitting, Cuff Size: Normal)   Pulse 68   Temp 97.7 F (36.5 C) (Temporal)   Ht 5\' 7"  (1.702 m)   Wt 201 lb (91.2 kg)   SpO2 98%   BMI 31.48 kg/m  Wt Readings from Last 3 Encounters:  02/25/20 201 lb (91.2 kg)  10/01/19 204 lb 8 oz (92.8 kg)  07/10/19 203 lb (92.1 kg)    Physical Exam Vitals and nursing note reviewed.  Constitutional:      Appearance: She is well-developed.  HENT:     Head: Normocephalic and atraumatic.  Cardiovascular:     Rate and Rhythm: Normal rate and regular rhythm.     Heart sounds: Normal heart sounds. No murmur. No friction rub. No gallop.   Pulmonary:     Effort: Pulmonary effort is normal. No tachypnea or respiratory distress.     Breath sounds: Normal breath sounds. No decreased breath sounds, wheezing, rhonchi or rales.  Chest:     Chest wall: No tenderness.  Abdominal:     General: Bowel sounds are normal.     Palpations: Abdomen is soft.  Musculoskeletal:        General: Normal range of motion.     Cervical back: Normal range of motion.  Skin:    General: Skin is warm and dry.  Neurological:     Mental Status: She is alert and oriented to person, place, and  time.     Coordination: Coordination normal.  Psychiatric:        Behavior: Behavior normal. Behavior is cooperative.        Thought Content: Thought content normal.        Judgment: Judgment normal.          Patient has been counseled extensively about nutrition and exercise as well as the importance of adherence with medications and regular follow-up. The patient was given clear instructions to go to ER or return to medical  center if symptoms don't improve, worsen or new problems develop. The patient verbalized understanding.   Follow-up: Return in about 2 weeks (around 03/10/2020) for BP recheck with LUKE .   Gildardo Pounds, FNP-BC [facility] [place], [place]   02/25/2020, 9:43 AM

## 2020-02-27 ENCOUNTER — Telehealth: Payer: Self-pay

## 2020-02-27 NOTE — Telephone Encounter (Signed)
Pt brought 3 forms for work. One form needs a signature and faxed to [place] at [facility] [phone].  The Healthcare Provider Request for Information form (2 pages) is to be completed and faxed. Please call pt when done pt wants to pick up originals and fax confirmations. Pt request ret call to discuss forms.

## 2020-02-28 LAB — FECAL OCCULT BLOOD, IMMUNOCHEMICAL: Fecal Occult Bld: NEGATIVE

## 2020-03-04 ENCOUNTER — Encounter: Payer: Self-pay | Admitting: Nurse Practitioner

## 2020-03-06 NOTE — Telephone Encounter (Signed)
Spoke to patient to asked who had completed the first page of the form. Pt. Stated he may be her, and will drop off a new form for PCP to fill out.  Pt. Stated when the form is complete, she prefer to get a call to pick up the form.

## 2020-03-08 ENCOUNTER — Encounter: Payer: Self-pay | Admitting: Nurse Practitioner

## 2020-03-11 ENCOUNTER — Encounter: Payer: Self-pay | Admitting: Nurse Practitioner

## 2020-03-17 ENCOUNTER — Ambulatory Visit: Payer: No Typology Code available for payment source

## 2020-05-17 ENCOUNTER — Other Ambulatory Visit: Payer: Self-pay

## 2020-05-17 ENCOUNTER — Emergency Department (HOSPITAL_COMMUNITY)
Admission: EM | Admit: 2020-05-17 | Discharge: 2020-05-17 | Disposition: A | Discharge status: Home / Self Care | Payer: No Typology Code available for payment source | Attending: Emergency Medicine | Admitting: Emergency Medicine

## 2020-05-17 DIAGNOSIS — Z5321 Procedure and treatment not carried out due to patient leaving prior to being seen by health care provider: Secondary | ICD-10-CM | POA: Insufficient documentation

## 2020-05-17 DIAGNOSIS — R519 Headache, unspecified: Secondary | ICD-10-CM | POA: Insufficient documentation

## 2020-05-17 DIAGNOSIS — I10 Essential (primary) hypertension: Secondary | ICD-10-CM | POA: Insufficient documentation

## 2020-05-17 NOTE — ED Triage Notes (Signed)
Patient reports she has had headache x2 days, went to work and bp was elevated at 210/183. Patient reports she has history of htn and takes medications.

## 2020-05-26 ENCOUNTER — Telehealth: Payer: Self-pay | Admitting: Nurse Practitioner

## 2020-05-26 ENCOUNTER — Encounter: Payer: Self-pay | Admitting: Nurse Practitioner

## 2020-05-26 ENCOUNTER — Ambulatory Visit: Payer: Self-pay | Admitting: *Deleted

## 2020-05-26 NOTE — Telephone Encounter (Signed)
Will forward to pcp

## 2020-05-26 NOTE — Telephone Encounter (Signed)
Patient called to request information about prescribed medications that are now expired. Patient reports she has not been taking prescribed medications and now she is due to elevated B/P. Patient had recent ED visit for elevated B/P. C/o medications make her feel like she is in a "daze" and very tired. Denies visual changes, headache slurred speech or weakness on one side at this time.Patient asked if she can take expired losartan and amlodipine now for elevated B/P that is 208/109 and rechecked 1-2 minutes later for 200/104. Care advise given to go to ED or urgent care. appt made for 07/04/20. Reviewed signs and symptoms of elevated B/P and stroke symptoms. Patient reports she has to go to work and will monitor if symptoms arise. Patient verbalized understanding of care advise and to go to ED or call 911 if symptoms worsen. Patient requesting earlier appt if possible on Tuesdays after 1200 and to text if possible due to she can not answer phone at work.  Text: [phone]. Please advise about taking expired medication.  Reason for Disposition  [1] Caller has URGENT medicine question about med that PCP or specialist prescribed AND [2] triager unable to answer question  Answer Assessment - Initial Assessment Questions 1. NAME of MEDICATION: "What medicine are you calling about?"     Losartan, amilodipine 2. QUESTION: "What is your question?" (e.g., medication refill, side effect)    These medications make me feel like I am in a "daze" and then very sleepy 3. PRESCRIBING HCP: "Who prescribed it?" Reason: if prescribed by specialist, call should be referred to that group.     CHW 4. SYMPTOMS: "Do you have any symptoms?"     Feeling tired 5. SEVERITY: If symptoms are present, ask "Are they mild, moderate or severe?"     B/P elevated 208/109 and 200/104 6. PREGNANCY:  "Is there any chance that you are pregnant?" "When was your last menstrual period?"     na  Protocols used: MEDICATION QUESTION  CALL-A-AH

## 2020-05-26 NOTE — Telephone Encounter (Signed)
She should not have expired medications. All her medications were filled on 02-25-2020 with refills for 6 months.

## 2020-07-04 ENCOUNTER — Ambulatory Visit: Payer: Self-pay | Attending: Nurse Practitioner | Admitting: Nurse Practitioner

## 2020-07-04 ENCOUNTER — Encounter: Payer: Self-pay | Admitting: Nurse Practitioner

## 2020-07-04 ENCOUNTER — Other Ambulatory Visit: Payer: Self-pay

## 2020-07-04 DIAGNOSIS — R7303 Prediabetes: Secondary | ICD-10-CM

## 2020-07-04 DIAGNOSIS — I10 Essential (primary) hypertension: Secondary | ICD-10-CM

## 2020-07-04 DIAGNOSIS — E782 Mixed hyperlipidemia: Secondary | ICD-10-CM

## 2020-07-04 MED ORDER — ATORVASTATIN CALCIUM 20 MG PO TABS: 20.0000 mg | ORAL_TABLET | Freq: Every day | ORAL | 3 refills | Status: DC

## 2020-07-04 MED ORDER — AMLODIPINE BESYLATE 10 MG PO TABS: 10.0000 mg | ORAL_TABLET | Freq: Every day | ORAL | 0 refills | Status: DC

## 2020-07-04 MED ORDER — LOSARTAN POTASSIUM 100 MG PO TABS: 100.0000 mg | ORAL_TABLET | Freq: Every day | ORAL | 1 refills | Status: DC

## 2020-07-04 MED ORDER — ATORVASTATIN CALCIUM 40 MG PO TABS: 40.0000 mg | ORAL_TABLET | Freq: Every day | ORAL | 2 refills | Status: DC

## 2020-07-04 MED ORDER — AMLODIPINE BESYLATE 10 MG PO TABS: 10.0000 mg | ORAL_TABLET | Freq: Every day | ORAL | 1 refills | Status: DC

## 2020-07-04 MED ORDER — LOSARTAN POTASSIUM 100 MG PO TABS: 100.0000 mg | ORAL_TABLET | Freq: Every day | ORAL | 0 refills | Status: DC

## 2020-07-04 MED FILL — ATORVASTATIN CALCIUM 20 MG: 20 | 90 days supply | Qty: 90 | Fill #0

## 2020-07-04 NOTE — Progress Notes (Signed)
Virtual Visit via Telephone Note Due to national recommendations of social distancing due to [place] 19, telehealth visit is felt to be most appropriate for this patient at this time.  I discussed the limitations, risks, security and privacy concerns of performing an evaluation and management service by telephone and the availability of in person appointments. I also discussed with the patient that there may be a patient responsible charge related to this service. The patient expressed understanding and agreed to proceed.    I connected with Shelby Rojas on 07/04/20  at   2:10 PM EDT  EDT by telephone and verified that I am speaking with the correct person using two identifiers.   Consent I discussed the limitations, risks, security and privacy concerns of performing an evaluation and management service by telephone and the availability of in person appointments. I also discussed with the patient that there may be a patient responsible charge related to this service. The patient expressed understanding and agreed to proceed.   Location of Patient: Private Residence   Location of Provider: [place] and [employer] Office    Persons participating in Telemedicine visit: Shelby Rankins FNP-BC Shelby Rojas    History of Present Illness: Telemedicine visit for: Essential Hypertension PMH: Anxiety, Chest pain, Cocaine use disorder ([place]), Diastolic dysfunction without heart failure, GERD (gastroesophageal reflux disease), High cholesterol, Hypertension, Obesity, Panic attacks, PNA (pneumonia), and Prediabetes.   Essential Hypertension Blood pressure is poorly controlled. She endorses medication adherence however antihypertensive prescriptions have currently expired. Current medications include amlodipine 10 mg daily and losartan 100 mg daily.  Denies chest pain, shortness of breath, palpitations, lightheadedness, dizziness, headaches or BLE edema.  BP Readings  from Last 3 Encounters:  05/17/20 (!) 193/120  02/25/20 (!) 171/97  10/02/19 (!) 177/100   Dyslipidemia Not at goal. Will increase atorvastatin to 40 mg daily. Denies statin intolerance or myalgias.  Lab Results  Component Value Date   LDLCALC 180 (H) 01/30/2020   The 10-year ASCVD risk score [name], et al., 2013) is: 32.4%   Values used to calculate the score:     Age: 59 years     Sex: Female     Is Non-Hispanic African American: Yes     Diabetic: No     Tobacco smoker: No     Systolic Blood Pressure: 903 mmHg     Is BP treated: Yes     HDL Cholesterol: 37 mg/dL     Total Cholesterol: 264 mg/dL Past Medical History:  Diagnosis Date  . Anxiety   . Chest pain   . Cocaine use disorder ([place])   . 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 GERD (gastroesophageal reflux disease)   . High cholesterol   . Hypertension   . Obesity   . Panic attacks   . PNA (pneumonia)   . Prediabetes     Past Surgical History:  Procedure Laterality Date  . CESAREAN SECTION      Family History  Problem Relation Age of Onset  . Breast cancer Maternal Aunt   . Arthritis Mother   . Prostate cancer Father   . Hypertension Father     Social History   Socioeconomic History  . Marital status: Single    Spouse name: Not on file  . Number of children: Not on file  . Years of education: Not on file  . Highest education level: Some college, no degree  Occupational History  . Not on file  Tobacco Use  . Smoking status: Never Smoker  . Smokeless tobacco: Never Used  Vaping Use  . Vaping Use: Never used  Substance and Sexual Activity  . Alcohol use: No  . Drug use: No  . Sexual activity: Not Currently    Birth control/protection: None  Other Topics Concern  . Not on file  Social History Narrative  . Not on file   Social Determinants of Health   Financial Resource Strain:   . Difficulty of Paying Living Expenses: Not on file  Food Insecurity:   . Worried About Paediatric nurse in the Last Year: Not on file  . Ran Out of Food in the Last Year: Not on file  Transportation Needs: No Transportation Needs  . Lack of Transportation (Medical): No  . Lack of Transportation (Non-Medical): No  Physical Activity:   . Days of Exercise per Week: Not on file  . Minutes of Exercise per Session: Not on file  Stress:   . Feeling of Stress : Not on file  Social Connections:   . Frequency of Communication with Friends and Family: Not on file  . Frequency of Social Gatherings with Friends and Family: Not on file  . Attends Religious Services: Not on file  . Active Member of Clubs or Organizations: Not on file  . Attends Archivist Meetings: Not on file  . Marital Status: Not on file     Observations/Objective: Awake, alert and oriented x 3   Review of Systems  Constitutional: Negative for fever, malaise/fatigue and weight loss.  HENT: Negative.  Negative for nosebleeds.   Eyes: Negative.  Negative for blurred vision, double vision and photophobia.  Respiratory: Negative.  Negative for cough and shortness of breath.   Cardiovascular: Negative.  Negative for chest pain, palpitations and leg swelling.  Gastrointestinal: Negative.  Negative for heartburn, nausea and vomiting.  Musculoskeletal: Negative.  Negative for myalgias.  Neurological: Negative.  Negative for dizziness, focal weakness, seizures and headaches.  Psychiatric/Behavioral: Negative.  Negative for suicidal ideas.    Assessment and Plan: Shelby Rojas was seen today for hypertension.  Diagnoses and all orders for this visit:  Essential hypertension -     CMP14+EGFR; Future -     Urinalysis, Complete; Future -     TSH; Future -     Discontinue: amLODipine (NORVASC) 10 MG tablet; Take 1 tablet (10 mg total) by mouth daily. -     Discontinue: losartan (COZAAR) 100 MG tablet; Take 1 tablet (100 mg total) by mouth daily. -     amLODipine (NORVASC) 10 MG tablet; Take 1 tablet (10 mg total) by  mouth daily. -     losartan (COZAAR) 100 MG tablet; Take 1 tablet (100 mg total) by mouth daily. Continue all antihypertensives as prescribed.  Remember to bring in your blood pressure log with you for your follow up appointment.  DASH/Mediterranean Diets are healthier choices for HTN.    Prediabetes -     Hemoglobin A1c; Future  Mixed hyperlipidemia -     Lipid panel; Future -     Discontinue: atorvastatin (LIPITOR) 20 MG tablet; Take 1 tablet (20 mg total) by mouth daily. -     atorvastatin (LIPITOR) 40 MG tablet; Take 1 tablet (40 mg total) by mouth daily. INSTRUCTIONS: Work on a low fat, heart healthy diet and participate in regular aerobic exercise program by working out at least 150 minutes per week; 5 days a week-30 minutes per day. Avoid red meat/beef/steak,  fried foods. junk foods, sodas, sugary drinks, unhealthy snacking, alcohol and smoking.  Drink at least 80 oz of water per day and monitor your carbohydrate intake daily.     Follow Up Instructions Return in about 8 weeks (around 08/29/2020).     I discussed the assessment and treatment plan with the patient. The patient was provided an opportunity to ask questions and all were answered. The patient agreed with the plan and demonstrated an understanding of the instructions.   The patient was advised to call back or seek an in-person evaluation if the symptoms worsen or if the condition fails to improve as anticipated.  I provided 17 minutes of non-face-to-face time during this encounter including median intraservice time, reviewing previous notes, labs, imaging, medications and explaining diagnosis and management.  Gildardo Pounds, FNP-BC

## 2020-07-07 ENCOUNTER — Ambulatory Visit (HOSPITAL_BASED_OUTPATIENT_CLINIC_OR_DEPARTMENT_OTHER): Payer: Self-pay | Admitting: Nurse Practitioner

## 2020-07-07 ENCOUNTER — Other Ambulatory Visit: Payer: Self-pay

## 2020-07-07 ENCOUNTER — Encounter: Payer: Self-pay | Admitting: Nurse Practitioner

## 2020-07-07 ENCOUNTER — Ambulatory Visit: Payer: Self-pay | Attending: Nurse Practitioner

## 2020-07-07 ENCOUNTER — Other Ambulatory Visit: Payer: Self-pay | Admitting: Nurse Practitioner

## 2020-07-07 DIAGNOSIS — E782 Mixed hyperlipidemia: Secondary | ICD-10-CM

## 2020-07-07 DIAGNOSIS — I10 Essential (primary) hypertension: Secondary | ICD-10-CM

## 2020-07-07 DIAGNOSIS — R7303 Prediabetes: Secondary | ICD-10-CM

## 2020-07-07 MED ORDER — ATORVASTATIN CALCIUM 40 MG PO TABS: 40.0000 mg | ORAL_TABLET | Freq: Every day | ORAL | 2 refills | Status: DC

## 2020-07-07 MED ORDER — AMLODIPINE BESYLATE 10 MG PO TABS: 10.0000 mg | ORAL_TABLET | Freq: Every day | ORAL | 0 refills | Status: DC

## 2020-07-07 MED ORDER — LOSARTAN POTASSIUM 100 MG PO TABS: 100.0000 mg | ORAL_TABLET | Freq: Every day | ORAL | 0 refills | Status: DC

## 2020-07-07 MED ORDER — VITAMIN D (ERGOCALCIFEROL) 1.25 MG (50000 UNIT) PO CAPS
50000.0000 [IU] | ORAL_CAPSULE | ORAL | 1 refills | Status: DC
Start: 2020-07-07 — End: 2020-07-07

## 2020-07-07 MED FILL — VIT D2 1.25 MG (50,000 UNIT: 1.25 MG | 84 days supply | Qty: 12 | Fill #0

## 2020-07-07 MED FILL — AMLODIPINE BESYLATE 10 MG T: 10 | 30 days supply | Qty: 30 | Fill #0

## 2020-07-07 MED FILL — ?ATORVASTATIN 40MG TABLET: 40 | 30 days supply | Qty: 30 | Fill #0

## 2020-07-07 MED FILL — LOSARTAN POTASSIUM 100 MG T: 100 | 30 days supply | Qty: 30 | Fill #0

## 2020-07-07 NOTE — Progress Notes (Signed)
Please disregard.  Appointment was made in error.

## 2020-07-08 LAB — LIPID PANEL
Chol/HDL Ratio: 6.2 ratio — ABNORMAL HIGH (ref 0.0–4.4)
Cholesterol, Total: 236 mg/dL — ABNORMAL HIGH (ref 100–199)
HDL: 38 mg/dL — ABNORMAL LOW (ref >39)
LDL Chol Calc (NIH): 170 mg/dL — ABNORMAL HIGH (ref 0–99)
Triglycerides: 152 mg/dL — ABNORMAL HIGH (ref 0–149)
VLDL Cholesterol Cal: 28 mg/dL (ref 5–40)

## 2020-07-08 LAB — CMP14+EGFR
ALT: 16 IU/L (ref 0–32)
AST: 17 IU/L (ref 0–40)
Albumin/Globulin Ratio: 1.8 (ref 1.2–2.2)
Albumin: 4.3 g/dL (ref 3.8–4.9)
Alkaline Phosphatase: 74 IU/L (ref 44–121)
BUN/Creatinine Ratio: 13 (ref 12–28)
BUN: 13 mg/dL (ref 8–27)
Bilirubin Total: 0.4 mg/dL (ref 0.0–1.2)
CO2: 24 mmol/L (ref 20–29)
Calcium: 9.6 mg/dL (ref 8.7–10.3)
Chloride: 104 mmol/L (ref 96–106)
Creatinine, Ser: 0.98 mg/dL (ref 0.57–1.00)
GFR calc Af Amer: 73 mL/min/{1.73_m2} (ref >59)
GFR calc non Af Amer: 63 mL/min/{1.73_m2} (ref >59)
Globulin, Total: 2.4 g/dL (ref 1.5–4.5)
Glucose: 105 mg/dL — ABNORMAL HIGH (ref 65–99)
Potassium: 3.7 mmol/L (ref 3.5–5.2)
Sodium: 143 mmol/L (ref 134–144)
Total Protein: 6.7 g/dL (ref 6.0–8.5)

## 2020-07-08 LAB — URINALYSIS, COMPLETE
Bilirubin, UA: NEGATIVE
Glucose, UA: NEGATIVE
Ketones, UA: NEGATIVE
Nitrite, UA: NEGATIVE
Protein,UA: NEGATIVE
Specific Gravity, UA: 1.023 (ref 1.005–1.030)
Urobilinogen, Ur: 0.2 mg/dL (ref 0.2–1.0)
pH, UA: 6 (ref 5.0–7.5)

## 2020-07-08 LAB — MICROSCOPIC EXAMINATION: Casts: NONE SEEN /lpf

## 2020-07-08 LAB — HEMOGLOBIN A1C
Est. average glucose Bld gHb Est-mCnc: 137 mg/dL
Hgb A1c MFr Bld: 6.4 % — ABNORMAL HIGH (ref 4.8–5.6)

## 2020-07-08 LAB — TSH: TSH: 0.746 u[IU]/mL (ref 0.450–4.500)

## 2020-09-02 ENCOUNTER — Ambulatory Visit: Payer: Self-pay | Admitting: Nurse Practitioner

## 2020-09-23 ENCOUNTER — Telehealth: Payer: Self-pay | Admitting: Nurse Practitioner

## 2020-09-23 NOTE — Telephone Encounter (Signed)
Pt states she spoke to [name] yesterday and [name] was to increase her bp medication.  But I do not see anything was sent to the pharmacy.  Or that she spoke to [name].  Pt states she would like a call back asap. Sh needs the increase sent to pharmacy, as her bp is elevated.   cb  [phone]

## 2020-09-24 NOTE — Telephone Encounter (Signed)
I have not spoken to ms. Stoffel in a few months. Can we verify when was the last time she picked up her losartan and amlodipine and what her blood pressure readings have been at home

## 2020-10-01 NOTE — Telephone Encounter (Signed)
Attempt to reach patient regarding her concerns. No answer and unable to LVM. CMA wanted to inform patient that based on her last virtual visit with PCP, PCP mention her cholesterol medication will be increase.

## 2020-10-14 ENCOUNTER — Ambulatory Visit: Payer: Self-pay | Admitting: *Deleted

## 2020-10-15 NOTE — Telephone Encounter (Signed)
Nurses from the [facility] have attempted several times to contact this pt without success.   Her call has been forwarded to [facility] for Bertram Denver, NP for further disposition.

## 2020-10-16 ENCOUNTER — Ambulatory Visit: Payer: Self-pay

## 2020-10-16 NOTE — Telephone Encounter (Signed)
Attempted to contact patient again. Mail box is full, unable to leave message.

## 2020-10-16 NOTE — Telephone Encounter (Signed)
Unable to reach patient after several attempts by NT. Routing to office.  Initial call from patient: Patient has COVID questions.

## 2020-10-16 NOTE — Telephone Encounter (Signed)
Attempted to contact patient. Unable to leave message. See previous messages in chart.

## 2020-10-22 NOTE — Telephone Encounter (Signed)
CMA attempt to reach patient. No answer and unable to reach patient.

## 2020-10-30 ENCOUNTER — Other Ambulatory Visit: Payer: Self-pay | Admitting: Physician Assistant

## 2020-10-30 ENCOUNTER — Ambulatory Visit: Payer: Self-pay | Attending: Physician Assistant | Admitting: Physician Assistant

## 2020-10-30 ENCOUNTER — Other Ambulatory Visit: Payer: Self-pay

## 2020-10-30 ENCOUNTER — Encounter: Payer: Self-pay | Admitting: Physician Assistant

## 2020-10-30 VITALS — BP 193/77 | HR 71 | Temp 98.8°F | Ht 67.0 in | Wt 186.0 lb

## 2020-10-30 DIAGNOSIS — R7303 Prediabetes: Secondary | ICD-10-CM

## 2020-10-30 DIAGNOSIS — G5603 Carpal tunnel syndrome, bilateral upper limbs: Secondary | ICD-10-CM

## 2020-10-30 DIAGNOSIS — I10 Essential (primary) hypertension: Secondary | ICD-10-CM

## 2020-10-30 MED ORDER — LOSARTAN POTASSIUM-HCTZ 100-25 MG PO TABS: 1.0000 | ORAL_TABLET | Freq: Every day | ORAL | 3 refills | Status: DC

## 2020-10-30 MED ORDER — AMLODIPINE BESYLATE 10 MG PO TABS: 10.0000 mg | ORAL_TABLET | Freq: Every day | ORAL | 0 refills | Status: DC

## 2020-10-30 MED FILL — AMLODIPINE BESYLATE 10 MG T: 10 | 30 days supply | Qty: 30 | Fill #0

## 2020-10-30 MED FILL — LOSARTAN-HCTZ 100-25 MG TAB: 100-25 | 30 days supply | Qty: 30 | Fill #0

## 2020-10-30 NOTE — Patient Instructions (Signed)
,Carpal Tunnel Syndrome  Carpal tunnel syndrome is a condition that causes pain, weakness, and numbness in your hand and arm. Numbness is when you cannot feel an area in your body. The carpal tunnel is a narrow area that is on the palm side of your wrist. Repeated wrist motion or certain diseases may cause swelling in the tunnel. This swelling can pinch the main nerve in the wrist. This nerve is called the median nerve. What are the causes? This condition may be caused by:  Moving your hand and wrist over and over again while doing a task.  Injury to the wrist.  Arthritis.  A sac of fluid (cyst) or abnormal growth (tumor) in the carpal tunnel.  Fluid buildup during pregnancy.  Use of tools that vibrate. Sometimes the cause is not known. What increases the risk? The following factors may make you more likely to have this condition:  Having a job that makes you do these things: ? Move your hand over and over again. ? Work with tools that vibrate, such as drills or sanders.  Being a woman.  Having diabetes, obesity, thyroid problems, or kidney failure. What are the signs or symptoms? Symptoms of this condition include:  A tingling feeling in your fingers.  Tingling or loss of feeling in your hand.  Pain in your entire arm. This pain may get worse when you bend your wrist and elbow for a long time.  Pain in your wrist that goes up your arm to your shoulder.  Pain that goes down into your palm or fingers.  Weakness in your hands. You may find it hard to grab and hold items. You may feel worse at night. How is this treated? This condition may be treated with:  Lifestyle changes. You will be asked to stop or change the activity that caused your problem.  Doing exercises and activities that make bones, muscles, and tendons stronger (physical therapy).  Learning how to use your hand again (occupational therapy).  Medicines for pain and swelling. You may have injections in  your wrist.  A wrist splint or brace.  Surgery. Follow these instructions at home: If you have a splint or brace:  Wear the splint or brace as told by your doctor. Take it off only as told by your doctor.  Loosen the splint if your fingers: ? Tingle. ? Become numb. ? Turn cold and blue.  Keep the splint or brace clean.  If the splint or brace is not waterproof: ? Do not let it get wet. ? Cover it with a watertight covering when you take a bath or a shower. Managing pain, stiffness, and swelling If told, put ice on the painful area:  If you have a removable splint or brace, remove it as told by your doctor.  Put ice in a plastic bag.  Place a towel between your skin and the bag.  Leave the ice on for 20 minutes, 2-3 times per day. Do not fall asleep with the cold pack on your skin.  Take off the ice if your skin turns bright red. This is very important. If you cannot feel pain, heat, or cold, you have a greater risk of damage to the area. Move your fingers often to reduce stiffness and swelling.   General instructions  Take over-the-counter and prescription medicines only as told by your doctor.  Rest your wrist from any activity that may cause pain. If needed, talk with your boss at work about changes that can  help your wrist heal.  Do exercises as told by your doctor, physical therapist, or occupational therapist.  Keep all follow-up visits. Contact a doctor if:  You have new symptoms.  Medicine does not help your pain.  Your symptoms get worse. Get help right away if:  You have very bad numbness or tingling in your wrist or hand. Summary  Carpal tunnel syndrome is a condition that causes pain in your hand and arm.  It is often caused by repeated wrist motions.  Lifestyle changes and medicines are used to treat this problem. Surgery may help in very bad cases.  Follow your doctor's instructions about wearing a splint, resting your wrist, keeping follow-up  visits, and calling for help. This information is not intended to replace advice given to you by your health care provider. Make sure you discuss any questions you have with your health care provider. Document Revised: 01/31/2020 Document Reviewed: 01/31/2020 Elsevier Patient Education  2021 [employer]  

## 2020-10-30 NOTE — Progress Notes (Signed)
Patient ID: DANISHA BRASSFIELD, female   DOB: [birth year], 60 y.o.   MRN: [id number]   Mallarie Voorhies, is a 60 y.o. female  AOZ:[id number]  NGE:[id number]  DOB - 07-27-1961  Subjective:  Chief Complaint and HPI: Fortunata Betty is a 60 y.o. female here today for BP check and tingling in her hands that is worse at night.  She has an appt with ortho tomorrow.  She is wearing a L wrist splint.  She is R hand dominant.  She has a R hand splint too but does not like to drive in it.  This has been going on for a couple of months.  Works for [employer]  Does a lot of repetitive work with hands. No weakness or dropping items  Checks BP OOO and it is "always high."  Compliant with losartan and amlodipine  She is working o diet to control sugar  ROS:   Constitutional:  No f/c, No night sweats, No unexplained weight loss. EENT:  No vision changes, No blurry vision, No hearing changes. No mouth, throat, or ear problems.  Respiratory: No cough, No SOB Cardiac: No CP, no palpitations GI:  No abd pain, No N/V/D. GU: No Urinary s/sx Musculoskeletal: see above Neuro: No headache, no dizziness, no motor weakness.  Skin: No rash Endocrine:  No polydipsia. No polyuria.  Psych: Denies SI/HI  No problems updated.  ALLERGIES: Allergies  Allergen Reactions  . Cyclobenzaprine Anaphylaxis    Pt  States  She  Is  Allergic  To  Muscle  relaxant    PAST MEDICAL HISTORY: Past Medical History:  Diagnosis Date  . Anxiety   . Chest pain   . Cocaine use disorder (HCC)   . 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 GERD (gastroesophageal reflux disease)   . High cholesterol   . Hypertension   . Obesity   . Panic attacks   . PNA (pneumonia)   . Prediabetes     MEDICATIONS AT HOME: Prior to Admission medications   Medication Sig Start Date End Date Taking? Authorizing Provider  losartan-hydrochlorothiazide (HYZAAR) 100-25 MG tablet Take 1 tablet by mouth daily. 10/30/20  Yes Jeovani Weisenburger, Marzella Schlein,  PA-C  Vitamin D, Ergocalciferol, (DRISDOL) 1.25 MG (50000 UNIT) CAPS capsule Take 1 capsule (50,000 Units total) by mouth every 7 (seven) days. 07/07/20  Yes Claiborne Rigg, NP  amLODipine (NORVASC) 10 MG tablet Take 1 tablet (10 mg total) by mouth daily. 10/30/20 01/28/21  Anders Simmonds, PA-C  atorvastatin (LIPITOR) 40 MG tablet Take 1 tablet (40 mg total) by mouth daily. 07/07/20 10/05/20  Claiborne Rigg, NP     Objective:  EXAM:   Vitals:   10/30/20 1018  BP: (!) 193/77  Pulse: 71  Temp: 98.8 F (37.1 C)  TempSrc: Oral  SpO2: 98%  Weight: 186 lb (84.4 kg)  Height: 5\' 7"  (1.702 m)    General appearance : A&OX3. NAD. Non-toxic-appearing HEENT: Atraumatic and Normocephalic.  PERRLA. EOM intact.   Chest/Lungs:  Breathing-non-labored, Good air entry bilaterally, breath sounds normal without rales, rhonchi, or wheezing  CVS: S1 S2 regular, no murmurs, gallops, rubs  +phalen's B wrists.  Normal grip and ROM.  Neg tinels Extremities: Bilateral Lower Ext shows no edema, both legs are warm to touch with = pulse throughout Neurology:  CN II-XII grossly intact, Non focal.   Psych:  TP linear. J/I WNL. Normal speech. Appropriate eye contact and affect.  Skin:  No Rash  Data Review Lab Results  Component Value Date   HGBA1C 6.4 (H) 07/07/2020   HGBA1C 6.4 (H) 10/03/2019     Assessment & Plan   1. Essential hypertension Uncontrolled-stop plain losartan and will do losartan HCT combo and continue amlodipine-last labs reviewed and stable - losartan-hydrochlorothiazide (HYZAAR) 100-25 MG tablet; Take 1 tablet by mouth daily.  Dispense: 90 tablet; Refill: 3 - amLODipine (NORVASC) 10 MG tablet; Take 1 tablet (10 mg total) by mouth daily.  Dispense: 90 tablet; Refill: 0 - Basic metabolic panel; Future(to check K+ since adding HCTZ)-[facility] app[facility] with [facility]  2. Bilateral carpal tunnel syndrome Info given and the patient has an appt tomorrow with ortho  3.  Hyperglycemia-I have had a  lengthy discussion and provided education about insulin resistance and the intake of too much sugar/refined carbohydrates.  I have advised the patient to work at a goal of eliminating sugary drinks, candy, desserts, sweets, refined sugars, processed foods, and white carbohydrates.  The patient expresses understanding.    Patient have been counseled extensively about nutrition and exercise  Return in about 3 weeks (around 11/20/2020) for [facility] for BP check and lab appt;  PCP in 3 months.  The patient was given clear instructions to go to ER or return to medical center if symptoms don't improve, worsen or new problems develop. The patient verbalized understanding. The patient was told to call to get lab results if they haven't heard anything in the next week.     [place], PA-C [facility] and [facility] [place], [place] [phone]   10/30/2020, 10:54 AM

## 2020-11-03 ENCOUNTER — Telehealth: Payer: Self-pay | Admitting: Nurse Practitioner

## 2020-11-03 NOTE — Telephone Encounter (Signed)
Patient came by to drop off Employee Questionnaire forms to be filled out by [employer]. Form will be placed in PCP box

## 2020-11-04 ENCOUNTER — Emergency Department (HOSPITAL_COMMUNITY)
Admission: EM | Admit: 2020-11-04 | Discharge: 2020-11-04 | Discharge status: Absent without leave | Payer: No Typology Code available for payment source

## 2020-11-04 NOTE — Telephone Encounter (Signed)
Patient came by this morning to see if forms were filled out. Explained it takes 7 to 14 days. Patient said she need the forms as soon as possible because she is not able to go back to work until then so her job sent her back home. Patient requested to receive a call when ready for pick up.

## 2020-11-04 NOTE — ED Notes (Signed)
I called patient name to check vital signs and no one responded 

## 2020-11-04 NOTE — Telephone Encounter (Signed)
Patient called in asking for the doctor to call and speak with her regarding paperwork.

## 2020-11-04 NOTE — Telephone Encounter (Signed)
Pt is back at work and needs the work accommodate form to be completed . Pt would like a callback [phone]

## 2020-11-05 ENCOUNTER — Encounter: Payer: Self-pay | Admitting: Nurse Practitioner

## 2020-11-05 ENCOUNTER — Other Ambulatory Visit: Payer: Self-pay | Admitting: Nurse Practitioner

## 2020-11-05 MED ORDER — GABAPENTIN 100 MG PO CAPS: 100.0000 mg | ORAL_CAPSULE | Freq: Three times a day (TID) | ORAL | 3 refills | Status: DC

## 2020-11-05 MED ORDER — IBUPROFEN 600 MG PO TABS: 600.0000 mg | ORAL_TABLET | Freq: Three times a day (TID) | ORAL | 0 refills | Status: DC | PRN

## 2020-11-05 MED FILL — GABAPENTIN 100 MG CAPSULE: 100 | 30 days supply | Qty: 90 | Fill #0

## 2020-11-05 MED FILL — ?IBUPROFEN 600 MG TABLETS: 600 | 10 days supply | Qty: 30 | Fill #0

## 2020-11-05 NOTE — Progress Notes (Unsigned)
Shelby Rojas was very upset today. She is requesting that paperwork be filled out immediately that she dropped off 3 days ago. She has been out of work since January 20th (I was unaware of this) and now is stating she has been out of work for hand pain and needs paperwork filled out. I informed her that I have not evaluated her for this and the PA that she spoke to a few days ago notes that Shelby Rojas told her she was following up with an orthopedist for this so her paperwork was not addressed for that visit. The front desk made her an appointment with me for March however she stated that was too far out and she wanted to see another provider. She was instructed that other providers will not be able to fill out this paperwork for her as they are not her PCP. She then stated she was taking her Paperwork to [facility] or [place] to be filled out.

## 2020-11-05 NOTE — Telephone Encounter (Signed)
Spoke to patient and informed she will need to have an OV for PCP to evaluate her hand.  Patient stated she cannot wait that long for an appointment with PCP and will take the form to another facility.

## 2020-11-25 NOTE — Progress Notes (Unsigned)
   S:     PCP: Bertram Denver  Patient arrives in good spirits. Presents to the clinic for hypertension evaluation, counseling, and management.  Patient was referred on 10/30/20.  Patient was last seen by Primary Care Provider on 07/24/20. At visit on 10/30/20, BP was elevated at 193/77 and reported compliance with BP medications. Pt ws switched from losartan 100 mg daily to combo losartan-HCTZ 100-25 mg daily and continued on amlodipine 10 mg daily.  Today, patient reports ***  LABs TODAY!  Compliance? Took meds this morning? When do you take your meds? Dizziness, headaches, blurred vision? History of swelling? Check Clinic BP? Home BP logs? If no logs, bring to next visit w/ BP cuff Go over BP goals Additional BP therapy if needed . Cleda Daub - BMET 07/2020 wnl . Carvedilol 70-80s .  Diet??  Exercise??   Medication adherence *** .  Current BP Medications include:  Amlodipine 10 mg daily, losartan-HCTZ 100-25 mg daily  Dietary habits include: *** Exercise habits include:*** Family / Social history: ***  O:   Home BP readings: ***  Last 3 Office BP readings: BP Readings from Last 3 Encounters:  10/30/20 (!) 193/77  05/17/20 (!) 193/120  02/25/20 (!) 171/97    BMET    Component Value Date/Time   NA 143 07/07/2020 0915   K 3.7 07/07/2020 0915   CL 104 07/07/2020 0915   CO2 24 07/07/2020 0915   GLUCOSE 105 (H) 07/07/2020 0915   GLUCOSE 86 02/01/2018 1432   BUN 13 07/07/2020 0915   CREATININE 0.98 07/07/2020 0915   CALCIUM 9.6 07/07/2020 0915   GFRNONAA 63 07/07/2020 0915   GFRAA 73 07/07/2020 0915    Renal function: CrCl cannot be calculated (Patient's most recent lab result is older than the maximum 21 days allowed.).  Clinical ASCVD: No  The 10-year ASCVD risk score Denman George DC Jr., et al., 2013) is: 17.7%   Values used to calculate the score:     Age: 42 years     Sex: Female     Is Non-Hispanic African American: Yes     Diabetic: No     Tobacco smoker:  No     Systolic Blood Pressure: 159 mmHg     Is BP treated: Yes     HDL Cholesterol: 38 mg/dL     Total Cholesterol: 236 mg/dL   A/P: Hypertension diagnosed *** currently *** on current medications. BP Goal = < *** mmHg. Medication adherence ***.  -{Meds adjust:18428} ***.  -F/u labs ordered - *** -Counseled on lifestyle modifications for blood pressure control including reduced dietary sodium, increased exercise, adequate sleep.  Results reviewed and written information provided.   Total time in face-to-face counseling *** minutes.   F/U Clinic Visit in ***.   Fabio Neighbors, PharmD, BCPS PGY2 Ambulatory Care Resident [facility]  Pharmacy

## 2020-11-26 ENCOUNTER — Ambulatory Visit: Payer: No Typology Code available for payment source | Admitting: Pharmacist

## 2020-11-27 ENCOUNTER — Ambulatory Visit: Payer: No Typology Code available for payment source | Admitting: Internal Medicine

## 2020-12-10 ENCOUNTER — Ambulatory Visit: Payer: No Typology Code available for payment source | Admitting: Pharmacist

## 2020-12-24 ENCOUNTER — Encounter: Payer: Self-pay | Admitting: Pharmacist

## 2020-12-24 ENCOUNTER — Other Ambulatory Visit: Payer: Self-pay | Admitting: Nurse Practitioner

## 2020-12-24 ENCOUNTER — Ambulatory Visit: Payer: Self-pay | Attending: Nurse Practitioner | Admitting: Pharmacist

## 2020-12-24 ENCOUNTER — Other Ambulatory Visit: Payer: Self-pay

## 2020-12-24 VITALS — BP 203/100

## 2020-12-24 DIAGNOSIS — N6320 Unspecified lump in the left breast, unspecified quadrant: Secondary | ICD-10-CM

## 2020-12-24 DIAGNOSIS — I10 Essential (primary) hypertension: Secondary | ICD-10-CM

## 2020-12-24 NOTE — Progress Notes (Signed)
   S:    PCP: [name]   Patient arrives in good spirits. Presents to the clinic for hypertension evaluation, counseling, and management. Patient was referred and last seen by Marylene Land on 10/30/20. At this visit, BP was still elevated so losartan/HCTZ 100/25 mg was added to current regimen. Patient was last seen by Primary Care Provider on 07/04/20 via telemedicine.   Today, pt reports poor medication adherence. Pt states that she has missed "some doses" in the past two weeks. Also states that she takes her two BP medications at separate times and not together. Reported some blurry vision and attributed that to why she hasn't been adherent to medications. Says she takes them "about every 3 days."  Medication adherence poor.  Current BP Medications include: amlodipine 10 mg daily, losartan/HCTZ 100-25 mg daily  Antihypertensives tried in the past include: none  Dietary habits include: tries to control sugar intake but likes to eat "a lot of goodies," reports that she limits her salt intake Exercise habits include: walks about 2 miles everyday Family History: HTN (father) Tobacco: none Alcohol: none  O:  Vitals:   12/24/20 1502  BP: (!) 203/100   Home BP readings: doesn't check at home  Last 3 Office BP readings: BP Readings from Last 3 Encounters:  12/24/20 (!) 203/100  10/30/20 (!) 193/77  05/17/20 (!) 193/120   BMET    Component Value Date/Time   NA 143 07/07/2020 0915   K 3.7 07/07/2020 0915   CL 104 07/07/2020 0915   CO2 24 07/07/2020 0915   GLUCOSE 105 (H) 07/07/2020 0915   GLUCOSE 86 02/01/2018 1432   BUN 13 07/07/2020 0915   CREATININE 0.98 07/07/2020 0915   CALCIUM 9.6 07/07/2020 0915   GFRNONAA 63 07/07/2020 0915   GFRAA 73 07/07/2020 0915   Renal function: CrCl cannot be calculated (Patient's most recent lab result is older than the maximum 21 days allowed.).  Clinical ASCVD: No  The ASCVD Risk score Denman George DC Jr., et al., 2013) failed to calculate for the  following reasons:   The valid systolic blood pressure range is 90 to 200 mmHg  A/P: Hypertension longstanding currently uncontrolled on current medications. BP Goal = <130/80 mmHg. Medication adherence is poor. Pt has been non-compliant with medications and BP remains well above goal. Of note, after seeing her BP in clinic today, she proceeded to take out her amlodipine and take 1 tablet while in clinic. Clonidine not administered. Counseled on taking  BOTH BP medications everyday at nighttime to get adequate reduction in BP. Educated on s/sx of when BP could be extremely high and when she should report to the ED if needed. -Continue amlodipine 10 mg daily -Continue losartan/HCTZ 100/25 mg daily -Counseled on lifestyle modifications for blood pressure control including reduced dietary sodium, increased exercise, adequate sleep.  Results reviewed and written information provided.   Total time in face-to-face counseling 20 minutes.   F/U Clinic Visit with Franky Macho in 1 week.    Patient seen with: Paulino Door, PharmD Candidate [employer]  Butch Penny, PharmD, [place], CPP Clinical Pharmacist [facility] [phone]

## 2020-12-24 NOTE — Progress Notes (Signed)
Patient report left axilla breast lump. She previously had an US of the same area 2020. Did not return for follow up for annual mammogram in 2021

## 2020-12-29 ENCOUNTER — Telehealth: Payer: Self-pay

## 2020-12-29 NOTE — Telephone Encounter (Signed)
Telephoned patient at home number. Voice mailbox unavailable to leave [facility] contact information.

## 2021-01-01 ENCOUNTER — Ambulatory Visit: Payer: Self-pay | Attending: Nurse Practitioner | Admitting: Pharmacist

## 2021-01-01 ENCOUNTER — Other Ambulatory Visit: Payer: Self-pay

## 2021-01-01 ENCOUNTER — Encounter: Payer: Self-pay | Admitting: Pharmacist

## 2021-01-01 VITALS — BP 177/107

## 2021-01-01 DIAGNOSIS — I10 Essential (primary) hypertension: Secondary | ICD-10-CM

## 2021-01-01 NOTE — Progress Notes (Signed)
   S:    PCP: [name]   Patient arrives in good spirits. Presents to the clinic for hypertension evaluation, counseling, and management. Patient was referred and last seen by Marylene Land on 10/30/20. We saw her on 12/24/2020 - BP was severely elevated d/t medication noncompliance.   Today, pt reports improved medication adherence. She has taken both BP medications every day since seeing [place] on 3/23.  Denies chest pain, blurred vision, dyspnea, HA. Reports that she feels well overall.   Medication adherence reported. Current BP Medications include: amlodipine 10 mg daily, losartan/HCTZ 100-25 mg daily  Antihypertensives tried in the past include: none  Dietary habits include: has eliminated her snacking; reports that she limits her salt intake Exercise habits include: walks about 2 miles everyday Family History: HTN (father) Tobacco: none Alcohol: none  O:  Vitals:   01/01/21 1509  BP: (!) 177/107   Home BP readings: doesn't check at home  Last 3 Office BP readings: BP Readings from Last 3 Encounters:  01/01/21 (!) 177/107  12/24/20 (!) 203/100  10/30/20 (!) 193/77   BMET    Component Value Date/Time   NA 143 07/07/2020 0915   K 3.7 07/07/2020 0915   CL 104 07/07/2020 0915   CO2 24 07/07/2020 0915   GLUCOSE 105 (H) 07/07/2020 0915   GLUCOSE 86 02/01/2018 1432   BUN 13 07/07/2020 0915   CREATININE 0.98 07/07/2020 0915   CALCIUM 9.6 07/07/2020 0915   GFRNONAA 63 07/07/2020 0915   GFRAA 73 07/07/2020 0915   Renal function: CrCl cannot be calculated (Patient's most recent lab result is older than the maximum 21 days allowed.).  Clinical ASCVD: No  The 10-year ASCVD risk score Denman George DC Jr., et al., 2013) is: 23.5%   Values used to calculate the score:     Age: 60 years     Sex: Female     Is Non-Hispanic African American: Yes     Diabetic: No     Tobacco smoker: No     Systolic Blood Pressure: 177 mmHg     Is BP treated: Yes     HDL Cholesterol: 38 mg/dL     Total  Cholesterol: 236 mg/dL  A/P: Hypertension longstanding currently uncontrolled on current medications. BP Goal = <130/80 mmHg. Of note, SBP has improved to 177 from 203. Medication adherence is improving. Will hold off on further changes. I informed her that we will likely start spironolactone in two weeks if BP is still above goal.   -Continue amlodipine 10 mg daily -Continue losartan/HCTZ 100/25 mg daily -Counseled on lifestyle modifications for blood pressure control including reduced dietary sodium, increased exercise, adequate sleep.  Results reviewed and written information provided.   Total time in face-to-face counseling 20 minutes.   F/U Clinic Visit with Franky Macho in 2 weeks.    Butch Penny, PharmD, [place], CPP Clinical Pharmacist [facility] [phone]

## 2021-01-02 ENCOUNTER — Other Ambulatory Visit: Payer: Self-pay

## 2021-01-02 DIAGNOSIS — R2232 Localized swelling, mass and lump, left upper limb: Secondary | ICD-10-CM

## 2021-01-02 NOTE — Progress Notes (Signed)
Left

## 2021-01-02 NOTE — Telephone Encounter (Signed)
Telephoned patient at home number. No answer, voicemail not setup, returned patient's call to schedule appointment with [facility]

## 2021-01-14 ENCOUNTER — Other Ambulatory Visit: Payer: Self-pay

## 2021-01-14 ENCOUNTER — Encounter: Payer: Self-pay | Admitting: Pharmacist

## 2021-01-14 ENCOUNTER — Ambulatory Visit: Payer: Self-pay | Attending: Nurse Practitioner | Admitting: Pharmacist

## 2021-01-14 VITALS — BP 177/94

## 2021-01-14 DIAGNOSIS — I10 Essential (primary) hypertension: Secondary | ICD-10-CM

## 2021-01-14 MED ORDER — LOSARTAN POTASSIUM-HCTZ 100-25 MG PO TABS
1.0000 | ORAL_TABLET | Freq: Every day | ORAL | 2 refills | Status: DC
  Filled 2021-01-14 – 2021-02-04 (×2): qty 30, 30d supply, fill #0

## 2021-01-14 MED ORDER — SPIRONOLACTONE 25 MG PO TABS
25.0000 mg | ORAL_TABLET | Freq: Every day | ORAL | 1 refills | Status: DC
Start: 2021-01-14 — End: 2021-08-12
  Filled 2021-01-14 – 2021-02-04 (×2): qty 30, 30d supply, fill #0

## 2021-01-14 MED ORDER — AMLODIPINE BESYLATE 10 MG PO TABS
10.0000 mg | ORAL_TABLET | Freq: Every day | ORAL | 2 refills | Status: DC
  Filled 2021-01-14 – 2021-04-13 (×3): qty 30, 30d supply, fill #0

## 2021-01-14 NOTE — Progress Notes (Signed)
   S:    PCP: [name]   Patient arrives in good spirits. Presents to the clinic for hypertension evaluation, counseling, and management. Patient was referred and last seen by Marylene Land on 10/30/20. We saw her on 01/01/2021 - BP was still elevated but her medication adherence was improving. All meds were continued.  Today, pt reports not missing any doses in the last two weeks and states that she did take her BP meds last night. Patient did complain of some blurry vision but denied chest pain, dyspnea, and HA.  Medication adherence reported. Current BP Medications include: amlodipine 10 mg daily, losartan/HCTZ 100-25 mg daily  Antihypertensives tried in the past include: none  Dietary habits include: has eliminated her snacking; reports that she limits her salt intake Exercise habits include: walks about 2 miles everyday Family History: HTN (father) Tobacco: none Alcohol: none  O:  Vitals:   01/14/21 1511  BP: (!) 177/94   Home BP readings: had one home reading of 123/93  Last 3 Office BP readings: BP Readings from Last 3 Encounters:  01/14/21 (!) 177/94  01/01/21 (!) 177/107  12/24/20 (!) 203/100   BMET    Component Value Date/Time   NA 143 07/07/2020 0915   K 3.7 07/07/2020 0915   CL 104 07/07/2020 0915   CO2 24 07/07/2020 0915   GLUCOSE 105 (H) 07/07/2020 0915   GLUCOSE 86 02/01/2018 1432   BUN 13 07/07/2020 0915   CREATININE 0.98 07/07/2020 0915   CALCIUM 9.6 07/07/2020 0915   GFRNONAA 63 07/07/2020 0915   GFRAA 73 07/07/2020 0915   Renal function: CrCl cannot be calculated (Patient's most recent lab result is older than the maximum 21 days allowed.).  Clinical ASCVD: No  The 10-year ASCVD risk score Denman George DC Jr., et al., 2013) is: 23.5%   Values used to calculate the score:     Age: 25 years     Sex: Female     Is Non-Hispanic African American: Yes     Diabetic: No     Tobacco smoker: No     Systolic Blood Pressure: 177 mmHg     Is BP treated: Yes     HDL  Cholesterol: 38 mg/dL     Total Cholesterol: 236 mg/dL  A/P: Hypertension longstanding currently uncontrolled on current medications. BP Goal = <130/80 mmHg. Of note, SBP has improved to 177 from 203. Medication adherence is greatly improving as she has had problems with this in the past. BP is still well above goal, so spironolactone was initiated today for further BP lowering -Start spironolactone 25 mg daily -Continue amlodipine 10 mg daily -Continue losartan/HCTZ 100/25 mg daily -Counseled on lifestyle modifications for blood pressure control including reduced dietary sodium, increased exercise, adequate sleep. -CMP today and in 1 week  Results reviewed and written information provided.   Total time in face-to-face counseling 20 minutes.   F/U Clinic Visit with Franky Macho in 1 week.  Butch Penny, PharmD, [place], CPP Clinical Pharmacist [facility] [phone]  Paulino Door, PharmD Candidate [employer]

## 2021-01-15 LAB — CMP14+EGFR
ALT: 19 IU/L (ref 0–32)
AST: 20 IU/L (ref 0–40)
Albumin/Globulin Ratio: 2 (ref 1.2–2.2)
Albumin: 4.7 g/dL (ref 3.8–4.8)
Alkaline Phosphatase: 76 IU/L (ref 44–121)
BUN/Creatinine Ratio: 19 (ref 12–28)
BUN: 15 mg/dL (ref 8–27)
Bilirubin Total: 0.4 mg/dL (ref 0.0–1.2)
CO2: 23 mmol/L (ref 20–29)
Calcium: 9.7 mg/dL (ref 8.7–10.3)
Chloride: 102 mmol/L (ref 96–106)
Creatinine, Ser: 0.8 mg/dL (ref 0.57–1.00)
Globulin, Total: 2.4 g/dL (ref 1.5–4.5)
Glucose: 107 mg/dL — ABNORMAL HIGH (ref 65–99)
Potassium: 3.6 mmol/L (ref 3.5–5.2)
Sodium: 142 mmol/L (ref 134–144)
Total Protein: 7.1 g/dL (ref 6.0–8.5)
eGFR: 84 mL/min/{1.73_m2} (ref >59)

## 2021-01-20 NOTE — Progress Notes (Unsigned)
   S:    PCP: [name]   Patient arrives in good spirits. Presents to the clinic for hypertension evaluation, counseling, and management. Patient was referred and last seen by Marylene Land on 10/30/20. Pt seen by pharmacy on 01/14/21. At that visit, BP was elevated at 177/94 and reported compliance with HTN medications. Pt initiated on spironolactone 25 mg daily.   Today, patient reports ***  BMET today since starting spironolactone  Compliance? Took meds this morning? When do you take your meds? Dizziness, headaches, blurred vision? History of swelling? Check Clinic BP? Home BP logs? If no logs, bring to next visit w/ BP cuff Go over BP goals Additional BP therapy if needed . Increase spiro to 50 mg at next visit . BB . hydralazine Diet??  Exercise??   Medication adherence *** .  Current BP Medications include: spironolactone 25 mg daily, amlodipine 10 mg daily, losartan/HCTZ 100-25 mg daily  Antihypertensives tried in the past include: none  Dietary habits include: has eliminated her snacking; reports that she limits her salt intake Exercise habits include: walks about 2 miles everyday Family History: HTN (father) Tobacco: none Alcohol: none   O:   Home BP readings: ***  Last 3 Office BP readings: BP Readings from Last 3 Encounters:  01/14/21 (!) 177/94  01/01/21 (!) 177/107  12/24/20 (!) 203/100    BMET    Component Value Date/Time   NA 142 01/14/2021 1525   K 3.6 01/14/2021 1525   CL 102 01/14/2021 1525   CO2 23 01/14/2021 1525   GLUCOSE 107 (H) 01/14/2021 1525   GLUCOSE 86 02/01/2018 1432   BUN 15 01/14/2021 1525   CREATININE 0.80 01/14/2021 1525   CALCIUM 9.7 01/14/2021 1525   GFRNONAA 63 07/07/2020 0915   GFRAA 73 07/07/2020 0915    Renal function: CrCl cannot be calculated (Unknown ideal weight.).  Clinical ASCVD: No  The 10-year ASCVD risk score Denman George DC Jr., et al., 2013) is: 23.5%   Values used to calculate the score:     Age: 60 years      Sex: Female     Is Non-Hispanic African American: Yes     Diabetic: No     Tobacco smoker: No     Systolic Blood Pressure: 177 mmHg     Is BP treated: Yes     HDL Cholesterol: 38 mg/dL     Total Cholesterol: 236 mg/dL   A/P: Hypertension diagnosed *** currently *** on current medications. BP Goal = <130/80 mmHg. Medication adherence ***.  -{Meds adjust:18428} ***.  -F/u labs ordered - *** -Counseled on lifestyle modifications for blood pressure control including reduced dietary sodium, increased exercise, adequate sleep.  Results reviewed and written information provided.   Total time in face-to-face counseling *** minutes.   F/U Clinic Visit in ***.   Fabio Neighbors, PharmD, BCPS PGY2 Ambulatory Care Resident [facility]  Pharmacy

## 2021-01-21 ENCOUNTER — Other Ambulatory Visit: Payer: Self-pay

## 2021-01-21 ENCOUNTER — Ambulatory Visit: Payer: No Typology Code available for payment source | Admitting: Pharmacist

## 2021-01-27 ENCOUNTER — Ambulatory Visit: Payer: No Typology Code available for payment source

## 2021-01-28 ENCOUNTER — Ambulatory Visit: Payer: No Typology Code available for payment source | Admitting: Nurse Practitioner

## 2021-02-04 ENCOUNTER — Other Ambulatory Visit: Payer: Self-pay

## 2021-02-05 ENCOUNTER — Inpatient Hospital Stay: Admission: RE | Admit: 2021-02-05 | Payer: No Typology Code available for payment source | Source: Ambulatory Visit

## 2021-02-05 ENCOUNTER — Ambulatory Visit: Payer: No Typology Code available for payment source

## 2021-02-11 ENCOUNTER — Other Ambulatory Visit: Payer: Self-pay

## 2021-04-13 ENCOUNTER — Other Ambulatory Visit: Payer: Self-pay

## 2021-04-20 ENCOUNTER — Other Ambulatory Visit: Payer: Self-pay

## 2021-07-29 ENCOUNTER — Ambulatory Visit: Payer: Self-pay

## 2021-07-29 NOTE — Telephone Encounter (Signed)
We do not have any openings at [facility]. You can try to see if there is any availability at any of the other clinics. If not she will need to go to urgent care or ED. It looks like she has not been taking her blood pressure medications as most of them are expired.

## 2021-07-29 NOTE — Telephone Encounter (Addendum)
Pt. Reports she has had a headache on and off x 2 days. Had her BP checked yesterday and it was 182/120. Unsure of what it is today. Would like to be seen as soon as possible. Will see anyone. Instructed to go to ED for worsening of symptoms.Please advise pt.    Reason for Disposition  [1] MODERATE headache (e.g., interferes with normal activities) AND [2] present > 24 hours AND [3] unexplained  (Exceptions: analgesics not tried, typical migraine, or headache part of viral illness)  Answer Assessment - Initial Assessment Questions 1. LOCATION: "Where does it hurt?"      Front  2. ONSET: "When did the headache start?" (Minutes, hours or days)      Yesterday 3. PATTERN: "Does the pain come and go, or has it been constant since it started?"     Comes and goes 4. SEVERITY: "How bad is the pain?" and "What does it keep you from doing?"  (e.g., Scale 1-10; mild, moderate, or severe)   - MILD (1-3): doesn't interfere with normal activities    - MODERATE (4-7): interferes with normal activities or awakens from sleep    - SEVERE (8-10): excruciating pain, unable to do any normal activities        Now - 3 5. RECURRENT SYMPTOM: "Have you ever had headaches before?" If Yes, ask: "When was the last time?" and "What happened that time?"      Yes 6. CAUSE: "What do you think is causing the headache?"     No 7. MIGRAINE: "Have you been diagnosed with migraine headaches?" If Yes, ask: "Is this headache similar?"      No 8. HEAD INJURY: "Has there been any recent injury to the head?"      No 9. OTHER SYMPTOMS: "Do you have any other symptoms?" (fever, stiff neck, eye pain, sore throat, cold symptoms)     No 10. PREGNANCY: "Is there any chance you are pregnant?" "When was your last menstrual period?"       No  Protocols used: Headache-A-AH

## 2021-07-30 NOTE — Telephone Encounter (Signed)
Called pt unable to reach left VM to call back. In case of emergency to refer to UC/ED

## 2021-08-12 ENCOUNTER — Other Ambulatory Visit: Payer: Self-pay

## 2021-08-12 ENCOUNTER — Encounter: Payer: Self-pay | Admitting: Physician Assistant

## 2021-08-12 ENCOUNTER — Ambulatory Visit: Payer: Self-pay | Attending: Physician Assistant | Admitting: Physician Assistant

## 2021-08-12 VITALS — BP 206/97 | HR 73 | Resp 16 | Wt 188.0 lb

## 2021-08-12 DIAGNOSIS — R739 Hyperglycemia, unspecified: Secondary | ICD-10-CM

## 2021-08-12 DIAGNOSIS — I10 Essential (primary) hypertension: Secondary | ICD-10-CM

## 2021-08-12 DIAGNOSIS — H938X3 Other specified disorders of ear, bilateral: Secondary | ICD-10-CM

## 2021-08-12 DIAGNOSIS — R7309 Other abnormal glucose: Secondary | ICD-10-CM

## 2021-08-12 DIAGNOSIS — R7303 Prediabetes: Secondary | ICD-10-CM

## 2021-08-12 DIAGNOSIS — E782 Mixed hyperlipidemia: Secondary | ICD-10-CM

## 2021-08-12 DIAGNOSIS — Z91199 Patient's noncompliance with other medical treatment and regimen due to unspecified reason: Secondary | ICD-10-CM

## 2021-08-12 DIAGNOSIS — Z1211 Encounter for screening for malignant neoplasm of colon: Secondary | ICD-10-CM

## 2021-08-12 MED ORDER — LOSARTAN POTASSIUM-HCTZ 100-25 MG PO TABS
1.0000 | ORAL_TABLET | Freq: Every day | ORAL | 2 refills | Status: DC
  Filled 2021-08-12 (×2): qty 30, 30d supply, fill #0

## 2021-08-12 MED ORDER — GABAPENTIN 100 MG PO CAPS
ORAL_CAPSULE | Freq: Three times a day (TID) | ORAL | 3 refills | Status: DC
  Filled 2021-08-12 (×2): qty 90, 30d supply, fill #0

## 2021-08-12 MED ORDER — ATORVASTATIN CALCIUM 40 MG PO TABS
ORAL_TABLET | Freq: Every day | ORAL | 2 refills | Status: DC
  Filled 2021-08-12 (×2): qty 30, 30d supply, fill #0

## 2021-08-12 MED ORDER — AMLODIPINE BESYLATE 10 MG PO TABS
10.0000 mg | ORAL_TABLET | Freq: Every day | ORAL | 2 refills | Status: DC
  Filled 2021-08-12 (×2): qty 30, 30d supply, fill #0

## 2021-08-12 MED ORDER — SPIRONOLACTONE 25 MG PO TABS
25.0000 mg | ORAL_TABLET | Freq: Every day | ORAL | 3 refills | Status: DC
  Filled 2021-08-12 (×2): qty 30, 30d supply, fill #0

## 2021-08-12 NOTE — Progress Notes (Signed)
Patient also states she popping in her ears.

## 2021-08-12 NOTE — Progress Notes (Signed)
Patient ID: Shelby Rojas, female   DOB: 02/11/1961, 60 y.o.   MRN: [id number]   Shelby Rojas, is a 60 y.o. female  YPP:[id number]  WPY:[id number]  DOB - 1961/06/05  Chief Complaint  Patient presents with   Medication Refill       Subjective:   Shelby Rojas is a 60 y.o. female here today for several issues.  She says she has been out of meds for about 6 weeks but it appears she never got any of them filled.  BP has been running high and she has been having HAs.  She does have a BP cuff at home.  She needs [employer] scholarship form.  C/o B ear popping.  No pain.  Also wants referral for colonoscopy-she has never had one.  No melena or hematochezia.  No change in stools.    No problems updated.  ALLERGIES: Allergies  Allergen Reactions   Cyclobenzaprine Anaphylaxis    Pt  States  She  Is  Allergic  To  Muscle  relaxant    PAST MEDICAL HISTORY: Past Medical History:  Diagnosis Date   Anxiety    Chest pain    Cocaine use disorder (HCC)    Diastolic dysfunction without heart failure    GERD (gastroesophageal reflux disease)    High cholesterol    Hypertension    Obesity    Panic attacks    PNA (pneumonia)    Prediabetes     MEDICATIONS AT HOME: Prior to Admission medications   Medication Sig Start Date End Date Taking? Authorizing Provider  amLODipine (NORVASC) 10 MG tablet Take 1 tablet (10 mg total) by mouth daily. 08/12/21 11/10/21  Anders Simmonds, PA-C  atorvastatin (LIPITOR) 40 MG tablet TAKE 1 TABLET (40 MG TOTAL) BY MOUTH DAILY. 08/12/21 08/12/22  Anders Simmonds, PA-C  gabapentin (NEURONTIN) 100 MG capsule TAKE 1 CAPSULE (100 MG TOTAL) BY MOUTH 3 (THREE) TIMES DAILY. 08/12/21 08/12/22  Anders Simmonds, PA-C  ibuprofen (ADVIL) 600 MG tablet TAKE 1 TABLET (600 MG TOTAL) BY MOUTH EVERY 8 (EIGHT) HOURS AS NEEDED FOR MODERATE PAIN. TAKE WITH FOOD 11/05/20 11/05/21  Claiborne Rigg, NP  losartan-hydrochlorothiazide (HYZAAR) 100-25 MG tablet Take 1 tablet by mouth daily.  08/12/21 11/10/21  Anders Simmonds, PA-C  spironolactone (ALDACTONE) 25 MG tablet Take 1 tablet (25 mg total) by mouth daily. 08/12/21   Anders Simmonds, PA-C  losartan (COZAAR) 100 MG tablet Take 1 tablet (100 mg total) by mouth daily. 07/07/20 10/30/20  Claiborne Rigg, NP    ROS: Neg HEENT Neg resp Neg cardiac Neg GI Neg GU Neg MS Neg psych Neg neuro  Objective:   Vitals:   08/12/21 1443  BP: (!) 206/97  Pulse: 73  Resp: 16  SpO2: 95%  Weight: 188 lb (85.3 kg)   Exam General appearance : Awake, alert, not in any distress. Speech Clear. Not toxic looking HEENT: Atraumatic and Normocephalic, external ears B WNL, unable to see very far into canal as she has very tortuous canals.   Neck: Supple, no JVD. No cervical lymphadenopathy.  Chest: Good air entry bilaterally, CTAB.  No rales/rhonchi/wheezing CVS: S1 S2 regular, no murmurs.  Extremities: B/L Lower Ext shows no edema, both legs are warm to touch Neurology: Awake alert, and oriented X 3, CN II-XII intact, Non focal Skin: No Rash  Data Review Lab Results  Component Value Date   HGBA1C 6.4 (H) 07/07/2020   HGBA1C 6.4 (H) 10/03/2019    Assessment &  Plan   1. Essential hypertension Check BP OOO daily and record and bring to next visit.  Resume meds. - losartan-hydrochlorothiazide (HYZAAR) 100-25 MG tablet; Take 1 tablet by mouth daily.  Dispense: 30 tablet; Refill: 2 - amLODipine (NORVASC) 10 MG tablet; Take 1 tablet (10 mg total) by mouth daily.  Dispense: 30 tablet; Refill: 2 - spironolactone (ALDACTONE) 25 MG tablet; Take 1 tablet (25 mg total) by mouth daily.  Dispense: 30 tablet; Refill: 3 - Comprehensive metabolic panel; Future - CBC with Differential/Platelet; Future  2. Mixed hyperlipidemia - atorvastatin (LIPITOR) 40 MG tablet; TAKE 1 TABLET (40 MG TOTAL) BY MOUTH DAILY.  Dispense: 90 tablet; Refill: 2 - Comprehensive metabolic panel; Future - Lipid panel; Future  3. Prediabetes I have had a lengthy  discussion and provided education about insulin resistance and the intake of too much sugar/refined carbohydrates.  I have advised the patient to work at a goal of eliminating sugary drinks, candy, desserts, sweets, refined sugars, processed foods, and white carbohydrates.  The patient expresses understanding.  - gabapentin (NEURONTIN) 100 MG capsule; TAKE 1 CAPSULE (100 MG TOTAL) BY MOUTH 3 (THREE) TIMES DAILY.  Dispense: 90 capsule; Refill: 3 - Comprehensive metabolic panel; Future - CBC with Differential/Platelet; Future  4. Colon cancer screening - Ambulatory referral to Gastroenterology  5. Elevated random blood glucose level I have had a lengthy discussion and provided education about insulin resistance and the intake of too much sugar/refined carbohydrates.  I have advised the patient to work at a goal of eliminating sugary drinks, candy, desserts, sweets, refined sugars, processed foods, and white carbohydrates.  The patient expresses understanding.  - Hemoglobin A1c; Future  6. Congestion of both ears Tortuous canals-unable to visualize TM B - Ambulatory referral to ENT  7. Non-compliance-it appears she never picks up meds.  Compliance imperative.  Explained at length.    [employer] scholarship form given by [facility] and confirmed by me.    Patient have been counseled extensively about nutrition and exercise. Other issues discussed during this visit include: low cholesterol diet, weight control and daily exercise, foot care, annual eye examinations at Ophthalmology, importance of adherence with medications and regular follow-up. We also discussed long term complications of uncontrolled diabetes and hypertension.   Return for 1 month with Franky Macho for BP and labs/3 months with PCP.  The patient was given clear instructions to go to ER or return to medical center if symptoms don't improve, worsen or new problems develop. The patient verbalized understanding. The patient was told to call to get lab  results if they haven't heard anything in the next week.      Georgian Co, PA-C [facility] [place], [place] [phone]   08/12/2021, 2:55 PM

## 2021-08-12 NOTE — Patient Instructions (Signed)
Check blood pressure daily and write down numbers and bring to next visit

## 2021-11-04 ENCOUNTER — Ambulatory Visit: Payer: Self-pay | Attending: Critical Care Medicine | Admitting: Critical Care Medicine

## 2021-11-04 ENCOUNTER — Other Ambulatory Visit: Payer: Self-pay

## 2021-11-04 ENCOUNTER — Encounter: Payer: Self-pay | Admitting: Critical Care Medicine

## 2021-11-04 VITALS — BP 205/97 | HR 72 | Resp 16 | Wt 186.8 lb

## 2021-11-04 DIAGNOSIS — E782 Mixed hyperlipidemia: Secondary | ICD-10-CM

## 2021-11-04 DIAGNOSIS — R7303 Prediabetes: Secondary | ICD-10-CM

## 2021-11-04 DIAGNOSIS — H6123 Impacted cerumen, bilateral: Secondary | ICD-10-CM

## 2021-11-04 DIAGNOSIS — I10 Essential (primary) hypertension: Secondary | ICD-10-CM

## 2021-11-04 DIAGNOSIS — Z139 Encounter for screening, unspecified: Secondary | ICD-10-CM

## 2021-11-04 DIAGNOSIS — M79602 Pain in left arm: Secondary | ICD-10-CM

## 2021-11-04 DIAGNOSIS — Z1231 Encounter for screening mammogram for malignant neoplasm of breast: Secondary | ICD-10-CM

## 2021-11-04 DIAGNOSIS — Z1211 Encounter for screening for malignant neoplasm of colon: Secondary | ICD-10-CM

## 2021-11-04 MED ORDER — CARVEDILOL 12.5 MG PO TABS
12.5000 mg | ORAL_TABLET | Freq: Two times a day (BID) | ORAL | 3 refills | Status: DC
  Filled 2021-11-04 – 2021-11-24 (×3): qty 60, 30d supply, fill #0

## 2021-11-04 MED ORDER — VALSARTAN-HYDROCHLOROTHIAZIDE 320-25 MG PO TABS
1.0000 | ORAL_TABLET | Freq: Every day | ORAL | 3 refills | Status: DC
  Filled 2021-11-04 – 2021-11-11 (×2): qty 90, 90d supply, fill #0
  Filled 2021-11-24: qty 30, 30d supply, fill #0

## 2021-11-04 MED ORDER — ATORVASTATIN CALCIUM 40 MG PO TABS
ORAL_TABLET | Freq: Every day | ORAL | 2 refills | Status: DC
  Filled 2021-11-04 – 2021-11-11 (×2): qty 90, 90d supply, fill #0

## 2021-11-04 MED ORDER — SPIRONOLACTONE 25 MG PO TABS
25.0000 mg | ORAL_TABLET | Freq: Every day | ORAL | 3 refills | Status: DC
  Filled 2021-11-04 – 2021-11-11 (×2): qty 30, 30d supply, fill #0

## 2021-11-04 MED ORDER — GABAPENTIN 300 MG PO CAPS
300.0000 mg | ORAL_CAPSULE | Freq: Three times a day (TID) | ORAL | 3 refills | Status: DC
  Filled 2021-11-04 – 2021-11-11 (×2): qty 90, 30d supply, fill #0

## 2021-11-04 MED ORDER — AMLODIPINE BESYLATE 10 MG PO TABS
10.0000 mg | ORAL_TABLET | Freq: Every day | ORAL | 2 refills | Status: DC
  Filled 2021-11-04 – 2021-11-24 (×3): qty 30, 30d supply, fill #0

## 2021-11-04 NOTE — Assessment & Plan Note (Signed)
Continue on Atorvastatin 40 mg. Refills sent to pharmacy.

## 2021-11-04 NOTE — Assessment & Plan Note (Addendum)
This patient's blood pressure remains uncontrolled despite medical therapy.   Begin taking Valsartan-HCTZ 320-25 mg. Begin Carvedilol 12.5 mg. Continue with Spironolactone 25 mg, and Amlodipine 10 mg. Education provided on the importance of following this medication regimen to avoid complications of hypertension such as stroke and MI.   Referral to [facility], clinical pharmacist, made. Patient should see him in two weeks for BP assessment and monitoring. CMP to be completed today to evaluate her kidney function.   Discussed lifestyle management of BP including diet low in sodium and processed foods, and high in fruits, vegetables, and lean meats

## 2021-11-04 NOTE — Assessment & Plan Note (Signed)
Upon inspection, profuse amount of wax was present in both ear canals fully obstructing canal and view of the TM. Ear lavage of both ears was performed by Carly, CMA today in office. A large amount of wax was evacuated bilaterally. The patient reports improvement in hearing already. Recommend that she can use OTC Debrox (carbamide peroxide) in the future to keep ears free of wax.

## 2021-11-04 NOTE — Assessment & Plan Note (Signed)
Patient experienced pain upon palpation and with movement of her left arm. Increase dosage of Gabapentin to 300 mg three times daily to treat this current pain she is having. She may require future referral to orthopedics for evaluation if pain does not improve.

## 2021-11-04 NOTE — Progress Notes (Signed)
Patient states that she feel at work and has had tingling in her left arm.

## 2021-11-04 NOTE — Assessment & Plan Note (Signed)
Patient to have routine labs for baseline levels performed today, including CBC and CMP.   She declined the flu vaccine at today's visit, and chose to defer the tDAP vaccine to her next visit.

## 2021-11-04 NOTE — Assessment & Plan Note (Signed)
Ambulatory referral to GI for screening colonoscopy made today. Patient provided with information to make call for appointment as well.

## 2021-11-04 NOTE — Patient Instructions (Addendum)
We have sent your medications to the pharmacy for you to pick up. We changed your BP medications - you will now take Valsartan and Carvedilol. Continue with spironolactone, amlodipine, and atorvastatin.   We have put in a referral for a colonoscopy and a mammogram. The name of the GI doctor and their phone number is listed. If they do not call you, please call them to get an appointment.   Labs will be completed today to check your kidneys, liver, and check your hemoglobin A1C.   Attached are examples of eating plans for your blood pressure. Focus on fruits, vegetables, and drinking lots of water.   We flushed your ears out today.   You declined the flu shot today.   See Franky Macho, the clinical pharmacist in 2 weeks.  See Bertram Denver, FNP, in 2 months.

## 2021-11-04 NOTE — Assessment & Plan Note (Signed)
Order for screening mammogram placed.  

## 2021-11-04 NOTE — Assessment & Plan Note (Signed)
HbA1C ordered to be completed in lab today. Patient education on lifestyle management for prediabetes provided, with emphasis on diet full of fruits, vegetables, and lean meats, and lower in processed foods and added sugars.

## 2021-11-04 NOTE — Progress Notes (Signed)
Established Patient Office Visit  Subjective:  Patient ID: Shelby Rojas, female    DOB: 1961/01/23  Age: 61 y.o. MRN: [id number]  CC:  Chief Complaint  Patient presents with   Medication Refill   Hypertension   Ear Fullness    HPI Shelby Rojas presents for medication refills today. She last saw Freeman Caldron 08/12/2021. At that time, she had hypertension with significantly elevated blood pressure, hyperlipidemia, and prediabetes. Today she still has significantly elevated BP, measuring 205/97 mmHg. She has been having headaches, chest tightness, shortness of breath, and eye pain. She is worried about this elevated pressure.  She also reports pain in her left arm. This pain is a result of a fall that she sustained at work about 1 week ago. 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 She feels a tingling sensation and has noticed worsening pain when she has to grip things repeatedly. She went to an urgent care center for this, and had x-rays done at that time. There were no significant findings at that time. She has not noticed any bruising either. She has been taking Ibuprofen 600 mg, and she regularly takes Gabapentin 100 mg three times daily.   She also has complaint of difficulty hearing out of her right ear. She also has pain in that ear. She thinks that it may be impacted with wax. She denies putting Q-tips or any other objects in her ear. She allows water to run into her ears in the shower. She denies any other nasal, sinus, or throat issues.  She is interested in getting a colonoscopy and is requesting a referral. She has never had a colonoscopy, but she is concerned as she had a grandmother and an aunt who had colon cancer. She is also requesting a mammogram. She had one about two years ago. She reports that she has had some abnormalities noted on past mammograms, but most recent mammogram was  negative.   She reports that she has not done much dietary management of her prediabetes or hypertension. She reports that her diet typically includes biscuits, steak, and soda. She is interested in changing her diet to manage her blood pressure and prevent diabetes.  Past Medical History:  Diagnosis Date   Anxiety    Chest pain    Cocaine use disorder (HCC)    Diastolic dysfunction without heart failure    GERD (gastroesophageal reflux disease)    High cholesterol    Hypertension    Obesity    Panic attacks    PNA (pneumonia)    Prediabetes     Past Surgical History:  Procedure Laterality Date   CESAREAN SECTION      Family History  Problem Relation Age of Onset   Breast cancer Maternal Aunt    Arthritis Mother    Prostate cancer Father    Hypertension Father     Social History   Socioeconomic History   Marital status: Single    Spouse name: Not on file   Number of children: Not on file   Years of education: Not on file   Highest education level: Some college, no degree  Occupational History   Not on file  Tobacco Use   Smoking status: Never   Smokeless tobacco: Never  Vaping Use   Vaping Use: Never used  Substance and Sexual Activity   Alcohol use: No   Drug use: No   Sexual activity: Not  Currently    Birth control/protection: None  Other Topics Concern   Not on file  Social History Narrative   Not on file   Social Determinants of Health   Financial Resource Strain: Not on file  Food Insecurity: Not on file  Transportation Needs: Not on file  Physical Activity: Not on file  Stress: Not on file  Social Connections: Not on file  Intimate Partner Violence: Not on file    Outpatient Medications Prior to Visit  Medication Sig Dispense Refill   amLODipine (NORVASC) 10 MG tablet Take 1 tablet (10 mg total) by mouth daily. 30 tablet 2   atorvastatin (LIPITOR) 40 MG tablet TAKE 1 TABLET (40 MG TOTAL) BY MOUTH DAILY. 90 tablet 2   gabapentin (NEURONTIN)  100 MG capsule TAKE 1 CAPSULE (100 MG TOTAL) BY MOUTH 3 (THREE) TIMES DAILY. 90 capsule 3   losartan-hydrochlorothiazide (HYZAAR) 100-25 MG tablet Take 1 tablet by mouth daily. 30 tablet 2   spironolactone (ALDACTONE) 25 MG tablet Take 1 tablet (25 mg total) by mouth daily. 30 tablet 3   ibuprofen (ADVIL) 600 MG tablet TAKE 1 TABLET (600 MG TOTAL) BY MOUTH EVERY 8 (EIGHT) HOURS AS NEEDED FOR MODERATE PAIN. TAKE WITH FOOD (Patient not taking: Reported on 11/04/2021) 30 tablet 0   No facility-administered medications prior to visit.    Allergies  Allergen Reactions   Cyclobenzaprine Anaphylaxis    Pt  States  She  Is  Allergic  To  Muscle  relaxant   Codeine     ROS Review of Systems  Constitutional:  Positive for fatigue.  HENT:  Positive for ear pain (feels something impacted in ear). Negative for nosebleeds.   Eyes:  Positive for pain.  Respiratory:  Positive for chest tightness and shortness of breath. Negative for cough.   Cardiovascular:  Positive for chest pain.  Gastrointestinal:  Positive for constipation.  Endocrine: Positive for polydipsia.  Genitourinary:  Positive for frequency.  Musculoskeletal:  Positive for back pain and myalgias.  Skin: Negative.   Neurological:  Positive for headaches.  Psychiatric/Behavioral:  Negative for dysphoric mood and sleep disturbance.      Objective:    Physical Exam Constitutional:      General: She is not in acute distress.    Appearance: Normal appearance.  HENT:     Head: Normocephalic and atraumatic.     Right Ear: There is impacted cerumen.     Left Ear: There is impacted cerumen.     Mouth/Throat:     Mouth: Mucous membranes are moist.     Pharynx: Oropharynx is clear.  Eyes:     General: No scleral icterus.    Conjunctiva/sclera: Conjunctivae normal.  Cardiovascular:     Rate and Rhythm: Normal rate and regular rhythm.     Pulses: Normal pulses.     Heart sounds: Normal heart sounds. No murmur heard.   No friction  rub. No gallop.  Pulmonary:     Effort: Pulmonary effort is normal.     Breath sounds: Normal breath sounds. No wheezing, rhonchi or rales.  Musculoskeletal:     Right elbow: Normal.     Left elbow: No swelling. Tenderness present.     Right forearm: Normal.     Left forearm: Tenderness present. No swelling or edema.  Skin:    General: Skin is warm and dry.  Neurological:     General: No focal deficit present.     Mental Status: She is alert and oriented to  person, place, and time.     Motor: No weakness.  Psychiatric:        Mood and Affect: Mood normal.        Behavior: Behavior normal.        Thought Content: Thought content normal.        Judgment: Judgment normal.    BP (!) 205/97    Pulse 72    Resp 16    Wt 186 lb 12.8 oz (84.7 kg)    SpO2 96%    BMI 29.26 kg/m  Wt Readings from Last 3 Encounters:  11/04/21 186 lb 12.8 oz (84.7 kg)  08/12/21 188 lb (85.3 kg)  10/30/20 186 lb (84.4 kg)     Health Maintenance Due  Topic Date Due   TETANUS/TDAP  Never done   Zoster Vaccines- Shingrix (1 of 2) Never done   COVID-19 Vaccine (3 - Booster for Pfizer series) 03/18/2020   MAMMOGRAM  07/09/2021    There are no preventive care reminders to display for this patient.  Lab Results  Component Value Date   TSH 0.746 07/07/2020   Lab Results  Component Value Date   WBC 9.5 01/30/2020   HGB 14.7 01/30/2020   HCT 45.2 01/30/2020   MCV 90 01/30/2020   PLT 261 01/30/2020   Lab Results  Component Value Date   NA 142 01/14/2021   K 3.6 01/14/2021   CO2 23 01/14/2021   GLUCOSE 107 (H) 01/14/2021   BUN 15 01/14/2021   CREATININE 0.80 01/14/2021   BILITOT 0.4 01/14/2021   ALKPHOS 76 01/14/2021   AST 20 01/14/2021   ALT 19 01/14/2021   PROT 7.1 01/14/2021   ALBUMIN 4.7 01/14/2021   CALCIUM 9.7 01/14/2021   ANIONGAP 8 07/26/2017   EGFR 84 01/14/2021   Lab Results  Component Value Date   CHOL 236 (H) 07/07/2020   Lab Results  Component Value Date   HDL 38 (L)  07/07/2020   Lab Results  Component Value Date   LDLCALC 170 (H) 07/07/2020   Lab Results  Component Value Date   TRIG 152 (H) 07/07/2020   Lab Results  Component Value Date   CHOLHDL 6.2 (H) 07/07/2020   Lab Results  Component Value Date   HGBA1C 6.4 (H) 07/07/2020      Assessment & Plan:   Problem List Items Addressed This Visit       Cardiovascular and Mediastinum   Essential hypertension    This patient's blood pressure remains uncontrolled despite medical therapy.   Begin taking Valsartan-HCTZ 320-25 mg. Begin Carvedilol 12.5 mg. Continue with Spironolactone 25 mg, and Amlodipine 10 mg. Education provided on the importance of following this medication regimen to avoid complications of hypertension such as stroke and MI.   Referral to [facility], clinical pharmacist, made. Patient should see him in two weeks for BP assessment and monitoring. CMP to be completed today to evaluate her kidney function.   Discussed lifestyle management of BP including diet low in sodium and processed foods, and high in fruits, vegetables, and lean meats      Relevant Medications   valsartan-hydrochlorothiazide (DIOVAN-HCT) 320-25 MG tablet   amLODipine (NORVASC) 10 MG tablet   atorvastatin (LIPITOR) 40 MG tablet   spironolactone (ALDACTONE) 25 MG tablet   carvedilol (COREG) 12.5 MG tablet   Other Relevant Orders   Comprehensive metabolic panel   CBC with Differential/Platelet     Nervous and Auditory   Impacted cerumen of both ears  Upon inspection, profuse amount of wax was present in both ear canals fully obstructing canal and view of the TM. Ear lavage of both ears was performed by Carly, CMA today in office. A large amount of wax was evacuated bilaterally. The patient reports improvement in hearing already. Recommend that she can use OTC Debrox (carbamide peroxide) in the future to keep ears free of wax.         Other   Hyperlipidemia    Continue on Atorvastatin 40 mg. Refills  sent to pharmacy.       Relevant Medications   valsartan-hydrochlorothiazide (DIOVAN-HCT) 320-25 MG tablet   amLODipine (NORVASC) 10 MG tablet   atorvastatin (LIPITOR) 40 MG tablet   spironolactone (ALDACTONE) 25 MG tablet   carvedilol (COREG) 12.5 MG tablet   Colon cancer screening    Ambulatory referral to GI for screening colonoscopy made today. Patient provided with information to make call for appointment as well.      Relevant Orders   Ambulatory referral to Gastroenterology   Encounter for screening mammogram for malignant neoplasm of breast - Primary    Order for screening mammogram placed.      Relevant Orders   MM DIGITAL SCREENING BILATERAL   Prediabetes    HbA1C ordered to be completed in lab today. Patient education on lifestyle management for prediabetes provided, with emphasis on diet full of fruits, vegetables, and lean meats, and lower in processed foods and added sugars.       Relevant Medications   gabapentin (NEURONTIN) 300 MG capsule   Other Relevant Orders   Comprehensive metabolic panel   Hemoglobin A1c   Pain of left upper extremity    Patient experienced pain upon palpation and with movement of her left arm. Increase dosage of Gabapentin to 300 mg three times daily to treat this current pain she is having. She may require future referral to orthopedics for evaluation if pain does not improve.       Encounter for health-related screening    Patient to have routine labs for baseline levels performed today, including CBC and CMP.   She declined the flu vaccine at today's visit, and chose to defer the tDAP vaccine to her next visit.       Meds ordered this encounter  Medications   valsartan-hydrochlorothiazide (DIOVAN-HCT) 320-25 MG tablet    Sig: Take 1 tablet by mouth daily.    Dispense:  90 tablet    Refill:  3   gabapentin (NEURONTIN) 300 MG capsule    Sig: Take 1 capsule (300 mg total) by mouth 3 (three) times daily.    Dispense:  90 capsule     Refill:  3   amLODipine (NORVASC) 10 MG tablet    Sig: Take 1 tablet (10 mg total) by mouth daily.    Dispense:  30 tablet    Refill:  2   atorvastatin (LIPITOR) 40 MG tablet    Sig: TAKE 1 TABLET (40 MG TOTAL) BY MOUTH DAILY.    Dispense:  90 tablet    Refill:  2   spironolactone (ALDACTONE) 25 MG tablet    Sig: Take 1 tablet (25 mg total) by mouth daily.    Dispense:  30 tablet    Refill:  3   carvedilol (COREG) 12.5 MG tablet    Sig: Take 1 tablet (12.5 mg total) by mouth 2 (two) times daily with a meal.    Dispense:  60 tablet    Refill:  3  38 min spent obtaining history and physical, assessment of HTN, complex decision making , ear lavage.  Patient education Follow-up: Return in about 2 months (around 01/02/2022).    Asencion Noble, MD

## 2021-11-04 NOTE — Progress Notes (Deleted)
Established Patient Office Visit  Subjective:  Patient ID: Shelby Rojas, female    DOB: July 21, 1961  Age: 61 y.o. MRN: [id number]  CC: No chief complaint on file.   HPI ALLEGRA CERNIGLIA presents for   . Essential hypertension Check BP OOO daily and record and bring to next visit.  Resume meds. - losartan-hydrochlorothiazide (HYZAAR) 100-25 MG tablet; Take 1 tablet by mouth daily.  Dispense: 30 tablet; Refill: 2 - amLODipine (NORVASC) 10 MG tablet; Take 1 tablet (10 mg total) by mouth daily.  Dispense: 30 tablet; Refill: 2 - spironolactone (ALDACTONE) 25 MG tablet; Take 1 tablet (25 mg total) by mouth daily.  Dispense: 30 tablet; Refill: 3 - Comprehensive metabolic panel; Future - CBC with Differential/Platelet; Future   2. Mixed hyperlipidemia - atorvastatin (LIPITOR) 40 MG tablet; TAKE 1 TABLET (40 MG TOTAL) BY MOUTH DAILY.  Dispense: 90 tablet; Refill: 2 - Comprehensive metabolic panel; Future - Lipid panel; Future   3. Prediabetes I have had a lengthy discussion and provided education about insulin resistance and the intake of too much sugar/refined carbohydrates.  I have advised the patient to work at a goal of eliminating sugary drinks, candy, desserts, sweets, refined sugars, processed foods, and white carbohydrates.  The patient expresses understanding.  - gabapentin (NEURONTIN) 100 MG capsule; TAKE 1 CAPSULE (100 MG TOTAL) BY MOUTH 3 (THREE) TIMES DAILY.  Dispense: 90 capsule; Refill: 3 - Comprehensive metabolic panel; Future - CBC with Differential/Platelet; Future   4. Colon cancer screening - Ambulatory referral to Gastroenterology   5. Elevated random blood glucose level I have had a lengthy discussion and provided education about insulin resistance and the intake of too much sugar/refined carbohydrates.  I have advised the patient to work at a goal of eliminating sugary drinks, candy, desserts, sweets, refined sugars, processed foods, and white carbohydrates.   The patient expresses understanding.  - Hemoglobin A1c; Future   6. Congestion of both ears Tortuous canals-unable to visualize TM B - Ambulatory referral to ENT   7. Non-compliance-it appears she never picks up meds.  Compliance imperative.  Explained at length.     [employer] scholarship form given by [facility] and confirmed by me.   Past Medical History:  Diagnosis Date   Anxiety    Chest pain    Cocaine use disorder (HCC)    Diastolic dysfunction without heart failure    GERD (gastroesophageal reflux disease)    High cholesterol    Hypertension    Obesity    Panic attacks    PNA (pneumonia)    Prediabetes     Past Surgical History:  Procedure Laterality Date   CESAREAN SECTION      Family History  Problem Relation Age of Onset   Breast cancer Maternal Aunt    Arthritis Mother    Prostate cancer Father    Hypertension Father     Social History   Socioeconomic History   Marital status: Single    Spouse name: Not on file   Number of children: Not on file   Years of education: Not on file   Highest education level: Some college, no degree  Occupational History   Not on file  Tobacco Use   Smoking status: Never   Smokeless tobacco: Never  Vaping Use   Vaping Use: Never used  Substance and Sexual Activity   Alcohol use: No   Drug use: No   Sexual activity: Not Currently    Birth control/protection: None  Other Topics  Concern   Not on file  Social History Narrative   Not on file   Social Determinants of Health   Financial Resource Strain: Not on file  Food Insecurity: Not on file  Transportation Needs: Not on file  Physical Activity: Not on file  Stress: Not on file  Social Connections: Not on file  Intimate Partner Violence: Not on file    Outpatient Medications Prior to Visit  Medication Sig Dispense Refill   amLODipine (NORVASC) 10 MG tablet Take 1 tablet (10 mg total) by mouth daily. 30 tablet 2   atorvastatin (LIPITOR) 40 MG tablet TAKE 1 TABLET (40  MG TOTAL) BY MOUTH DAILY. 90 tablet 2   gabapentin (NEURONTIN) 100 MG capsule TAKE 1 CAPSULE (100 MG TOTAL) BY MOUTH 3 (THREE) TIMES DAILY. 90 capsule 3   ibuprofen (ADVIL) 600 MG tablet TAKE 1 TABLET (600 MG TOTAL) BY MOUTH EVERY 8 (EIGHT) HOURS AS NEEDED FOR MODERATE PAIN. TAKE WITH FOOD 30 tablet 0   losartan-hydrochlorothiazide (HYZAAR) 100-25 MG tablet Take 1 tablet by mouth daily. 30 tablet 2   spironolactone (ALDACTONE) 25 MG tablet Take 1 tablet (25 mg total) by mouth daily. 30 tablet 3   No facility-administered medications prior to visit.    Allergies  Allergen Reactions   Cyclobenzaprine Anaphylaxis    Pt  States  She  Is  Allergic  To  Muscle  relaxant    ROS Review of Systems    Objective:    Physical Exam  There were no vitals taken for this visit. Wt Readings from Last 3 Encounters:  08/12/21 188 lb (85.3 kg)  10/30/20 186 lb (84.4 kg)  05/17/20 196 lb 11.2 oz (89.2 kg)     Health Maintenance Due  Topic Date Due   TETANUS/TDAP  Never done   Zoster Vaccines- Shingrix (1 of 2) Never done   COVID-19 Vaccine (3 - Booster for Pfizer series) 03/18/2020   INFLUENZA VACCINE  Never done   MAMMOGRAM  07/09/2021    There are no preventive care reminders to display for this patient.  Lab Results  Component Value Date   TSH 0.746 07/07/2020   Lab Results  Component Value Date   WBC 9.5 01/30/2020   HGB 14.7 01/30/2020   HCT 45.2 01/30/2020   MCV 90 01/30/2020   PLT 261 01/30/2020   Lab Results  Component Value Date   NA 142 01/14/2021   K 3.6 01/14/2021   CO2 23 01/14/2021   GLUCOSE 107 (H) 01/14/2021   BUN 15 01/14/2021   CREATININE 0.80 01/14/2021   BILITOT 0.4 01/14/2021   ALKPHOS 76 01/14/2021   AST 20 01/14/2021   ALT 19 01/14/2021   PROT 7.1 01/14/2021   ALBUMIN 4.7 01/14/2021   CALCIUM 9.7 01/14/2021   ANIONGAP 8 07/26/2017   EGFR 84 01/14/2021   Lab Results  Component Value Date   CHOL 236 (H) 07/07/2020   Lab Results  Component  Value Date   HDL 38 (L) 07/07/2020   Lab Results  Component Value Date   LDLCALC 170 (H) 07/07/2020   Lab Results  Component Value Date   TRIG 152 (H) 07/07/2020   Lab Results  Component Value Date   CHOLHDL 6.2 (H) 07/07/2020   Lab Results  Component Value Date   HGBA1C 6.4 (H) 07/07/2020      Assessment & Plan:   Problem List Items Addressed This Visit   None   No orders of the defined types were placed in  this encounter.   Follow-up: No follow-ups on file.    Asencion Noble, MD

## 2021-11-05 ENCOUNTER — Telehealth: Payer: Self-pay

## 2021-11-05 LAB — CBC WITH DIFFERENTIAL/PLATELET
Basophils Absolute: 0.1 10*3/uL (ref 0.0–0.2)
Basos: 1 %
EOS (ABSOLUTE): 0.2 10*3/uL (ref 0.0–0.4)
Eos: 3 %
Hematocrit: 44.9 % (ref 34.0–46.6)
Hemoglobin: 14.9 g/dL (ref 11.1–15.9)
Immature Grans (Abs): 0 10*3/uL (ref 0.0–0.1)
Immature Granulocytes: 0 %
Lymphocytes Absolute: 2.1 10*3/uL (ref 0.7–3.1)
Lymphs: 25 %
MCH: 29 pg (ref 26.6–33.0)
MCHC: 33.2 g/dL (ref 31.5–35.7)
MCV: 87 fL (ref 79–97)
Monocytes Absolute: 0.7 10*3/uL (ref 0.1–0.9)
Monocytes: 8 %
Neutrophils Absolute: 5.2 10*3/uL (ref 1.4–7.0)
Neutrophils: 63 %
Platelets: 283 10*3/uL (ref 150–450)
RBC: 5.14 x10E6/uL (ref 3.77–5.28)
RDW: 12.6 % (ref 11.7–15.4)
WBC: 8.3 10*3/uL (ref 3.4–10.8)

## 2021-11-05 LAB — HEMOGLOBIN A1C
Est. average glucose Bld gHb Est-mCnc: 128 mg/dL
Hgb A1c MFr Bld: 6.1 % — ABNORMAL HIGH (ref 4.8–5.6)

## 2021-11-05 LAB — COMPREHENSIVE METABOLIC PANEL
ALT: 18 IU/L (ref 0–32)
AST: 23 IU/L (ref 0–40)
Albumin/Globulin Ratio: 1.8 (ref 1.2–2.2)
Albumin: 4.7 g/dL (ref 3.8–4.8)
Alkaline Phosphatase: 81 IU/L (ref 44–121)
BUN/Creatinine Ratio: 21 (ref 12–28)
BUN: 18 mg/dL (ref 8–27)
Bilirubin Total: 0.4 mg/dL (ref 0.0–1.2)
CO2: 29 mmol/L (ref 20–29)
Calcium: 10 mg/dL (ref 8.7–10.3)
Chloride: 99 mmol/L (ref 96–106)
Creatinine, Ser: 0.87 mg/dL (ref 0.57–1.00)
Globulin, Total: 2.6 g/dL (ref 1.5–4.5)
Glucose: 97 mg/dL (ref 70–99)
Potassium: 3.9 mmol/L (ref 3.5–5.2)
Sodium: 143 mmol/L (ref 134–144)
Total Protein: 7.3 g/dL (ref 6.0–8.5)
eGFR: 76 mL/min/{1.73_m2} (ref >59)

## 2021-11-05 NOTE — Telephone Encounter (Signed)
-----   Message from Storm Frisk, MD sent at 11/05/2021  5:52 AM EST ----- Let pt know kidney liver blood counts normal    has prediabetes  recommend healthy diet options we discussed and exercise : walking 3-5 times a week

## 2021-11-05 NOTE — Telephone Encounter (Signed)
Pt was called and vm was left, Information has been sent to nurse pool.   

## 2021-11-11 ENCOUNTER — Other Ambulatory Visit: Payer: Self-pay

## 2021-11-11 ENCOUNTER — Encounter: Payer: Self-pay | Admitting: Physician Assistant

## 2021-11-18 ENCOUNTER — Other Ambulatory Visit: Payer: Self-pay

## 2021-11-19 ENCOUNTER — Telehealth: Payer: No Typology Code available for payment source | Admitting: Physician Assistant

## 2021-11-19 ENCOUNTER — Ambulatory Visit: Payer: Self-pay

## 2021-11-19 NOTE — Progress Notes (Signed)
The patient no-showed for appointment despite this provider sending direct link, reaching out via phone with no response and waiting for at least 10 minutes from appointment time for patient to join. They will be marked as a NS for this appointment/time.  ? ?Katasha Riga Cody Jazz Biddy, PA-C ? ? ? ?

## 2021-11-19 NOTE — Telephone Encounter (Signed)
Needs tele or virtual visit scheduled for any Tuesday please. Thanks.

## 2021-11-19 NOTE — Telephone Encounter (Signed)
Chief Complaint: left arm pain  Symptoms: from wrist to elbow Frequency: onset x 1 month Pertinent Negatives: Patient denies other symptoms Disposition: [] ED /[] Urgent Care (no appt availability in office) / [] Appointment(In office/virtual)/ [x]  [facility]/ [] Home Care/ [] Refused Recommended Disposition /[] [facility]/ []  Follow-up with PCP Additional Notes: Patient says she was seen for this and wanted to know if the referral was place, advised no referral, offered virtual UC visit today.   Reason for Disposition  [1] MODERATE pain (e.g., interferes with normal activities) AND [2] present > 3 days  Answer Assessment - Initial Assessment Questions 1. ONSET: "When did the pain start?"     1 month 2. LOCATION: "Where is the pain located?"     From wrist to the elbow left arm 3. PAIN: "How bad is the pain?" (Scale 1-10; or mild, moderate, severe)   - MILD (1-3): doesn't interfere with normal activities   - MODERATE (4-7): interferes with normal activities (e.g., work or school) or awakens from sleep   - SEVERE (8-10): excruciating pain, unable to do any normal activities, unable to hold a cup of water     7-8 5. OTHER SYMPTOMS: "Do you have any other symptoms?" (e.g., neck pain, swelling, rash, fever, numbness, weakness)     No  Protocols used: Arm Pain-A-AH

## 2021-11-23 ENCOUNTER — Telehealth: Payer: Self-pay

## 2021-11-23 NOTE — Telephone Encounter (Signed)
Left message to return call to our office.  

## 2021-11-23 NOTE — Progress Notes (Signed)
11/25/2021 YOHANA BARTHA [id number] 13-Aug-1961   ASSESSMENT AND PLAN:  Chronic idiopathic constipation - Increase fiber/ water intake, decrease caffeine, increase activity level, can add  miralax and fiber  until BM soft.  Please go to the hospital if you have severe abdominal pain, vomiting, fever, CP, SOB.   Essential hypertension Continue to work with PCP to get this controlled, recheck was improve in the office.  No CP, SOB  Colon cancer screening Will need 2 day prep We have discussed the risks of bleeding, infection, perforation, medication reactions, a 10-20% miss rate for small colon cancer or polyp and remote risk of death associated with colonoscopy. All questions were answered and the patient acknowledges these risk and wishes to proceed.  GERD Mainly diet driven denies breakthrough reflux, burning in chest, hoarseness or cough. Lifestyle changes discussed, avoid NSAIDS which patient does take Can do pepcid as needed   Patient Care Team: Claiborne Rigg, NP as PCP - General (Nurse Practitioner)  HISTORY OF PRESENT ILLNESS: 61 y.o. female referred by Claiborne Rigg, NP, with a past medical history of hypertension, hyperlipidemia and others listed below presents for evaluation of Abdominal pain, colonoscopy.  She is following with PCP to control her BP, recheck better. Here.  She has had AB bloating and constipation with BM every 4 days.  She has bilateral lower AB pain, works 10 hours a day, works for [employer], on her feet.  She drinks a lot of water, increasing fiber since Jan without help. Denies hematochezia.  She took laxative last night, [facility].  She has GERD with salads. She does take ibuprofen and aleve.   Denies dysphagia, nausea, vomiting, melena.  Has lost about 6 lbs without trying since Nov. She has good appetite.  No family history of GI malignancy. Dad with prostate cancer.   Wt Readings from Last 3 Encounters:  11/25/21 182 lb 2 oz (82.6  kg)  11/04/21 186 lb 12.8 oz (84.7 kg)  08/12/21 188 lb (85.3 kg)    External labs and notes reviewed this visit: CBC  11/04/2021  HGB 14.9 MCV 87 without evidence of anemia WBC 8.3 Platelets 283 Kidney function 11/04/2021  BUN 18 Cr 0.87  GFR 63  Potassium 3.9   LFTs 11/04/2021  AST 23 ALT 18 Alkphos 81 TBili 0.4 07/07/2020 TSH 0.746   Current Medications:    Current Outpatient Medications (Cardiovascular):    amLODipine (NORVASC) 10 MG tablet, Take 1 tablet (10 mg total) by mouth daily.   atorvastatin (LIPITOR) 40 MG tablet, TAKE 1 TABLET (40 MG TOTAL) BY MOUTH DAILY.   carvedilol (COREG) 12.5 MG tablet, Take 1 tablet (12.5 mg total) by mouth 2 (two) times daily with a meal.   spironolactone (ALDACTONE) 25 MG tablet, Take 1 tablet (25 mg total) by mouth daily.   valsartan-hydrochlorothiazide (DIOVAN-HCT) 320-25 MG tablet, Take 1 tablet by mouth daily.     Current Outpatient Medications (Other):    gabapentin (NEURONTIN) 300 MG capsule, Take 1 capsule (300 mg total) by mouth 3 (three) times daily.  Medical History:  Past Medical History:  Diagnosis Date   Anxiety    Arthritis    Chest pain    Cocaine use disorder (HCC)    Diastolic dysfunction without heart failure    GERD (gastroesophageal reflux disease)    High cholesterol    Hypertension    Obesity    Panic attacks    PNA (pneumonia)    Prediabetes    Allergies:  Allergies  Allergen Reactions   Codeine Anaphylaxis   Flexeril [Cyclobenzaprine] Anaphylaxis    Pt  States  She  Is  Allergic  To  Muscle  relaxant     Surgical History:  She  has a past surgical history that includes Cesarean section and Coronary angioplasty with stent. Family History:  Her family history includes Anemia in her sister; Arthritis in her mother; Breast cancer in her paternal grandmother; Hypertension in her father and sister; Migraines in her daughter; Prostate cancer in her father. Social History:   reports that she quit smoking  about 7 years ago. Her smoking use included cigarettes. She has never used smokeless tobacco. She reports current alcohol use. She reports that she does not use drugs.  REVIEW OF SYSTEMS  : All other systems reviewed and negative except where noted in the History of Present Illness.   PHYSICAL EXAM: BP 140/90 (BP Location: Left Arm, Patient Position: Sitting, Cuff Size: Normal)    Pulse 72    Ht 5' 5.75" (1.67 m) Comment: height measured without shoes   Wt 182 lb 2 oz (82.6 kg)    BMI 29.62 kg/m  General:   Pleasant, well developed female in no acute distress Head:  Normocephalic and atraumatic. Eyes: sclerae anicteric,conjunctive pink  Heart:  regular rate and rhythm Pulm: Clear anteriorly; no wheezing Abdomen:  Soft, Obese AB, skin exam normal, Normal bowel sounds. mild tenderness in the lower abdomen. Without guarding and Without rebound, without hepatomegaly. Extremities:  Without edema. Msk:  Symmetrical without gross deformities. Peripheral pulses intact.  Neurologic:  Alert and  oriented x4;  grossly normal neurologically. Skin:   Dry and intact without significant lesions or rashes. Psychiatric: Demonstrates good judgement and reason without abnormal affect or behaviors.   Doree Albee, PA-C 11:10 AM

## 2021-11-24 ENCOUNTER — Other Ambulatory Visit: Payer: Self-pay | Admitting: Nurse Practitioner

## 2021-11-24 ENCOUNTER — Other Ambulatory Visit: Payer: Self-pay

## 2021-11-24 NOTE — Telephone Encounter (Signed)
Patient states she is no longer a patient at [facility] and no call back is needed.

## 2021-11-24 NOTE — Telephone Encounter (Signed)
Requested medications are due for refill today.  unsure  Requested medications are on the active medications list.  no  Last refill. 11/05/2020  Future visit scheduled.   no  Notes to clinic.  Med not on med list. Per chart pt no longer taking 11/04/2021    Requested Prescriptions  Pending Prescriptions Disp Refills   ibuprofen (ADVIL) 600 MG tablet 30 tablet 0    Sig: TAKE 1 TABLET (600 MG TOTAL) BY MOUTH EVERY 8 (EIGHT) HOURS AS NEEDED FOR MODERATE PAIN. TAKE WITH FOOD     Analgesics:  NSAIDS Failed - 11/24/2021 11:04 AM      Failed - Manual Review: Labs are only required if the patient has taken medication for more than 8 weeks.      Passed - Cr in normal range and within 360 days    Creatinine, Ser  Date Value Ref Range Status  11/04/2021 0.87 0.57 - 1.00 mg/dL Final          Passed - HGB in normal range and within 360 days    Hemoglobin  Date Value Ref Range Status  11/04/2021 14.9 11.1 - 15.9 g/dL Final          Passed - PLT in normal range and within 360 days    Platelets  Date Value Ref Range Status  11/04/2021 283 150 - 450 x10E3/uL Final          Passed - HCT in normal range and within 360 days    Hematocrit  Date Value Ref Range Status  11/04/2021 44.9 34.0 - 46.6 % Final          Passed - eGFR is 30 or above and within 360 days    GFR calc Af Amer  Date Value Ref Range Status  07/07/2020 73 >59 mL/min/1.73 Final    Comment:    **[facility] currently reports eGFR in compliance with the current**   recommendations of the [employer]. [facility] will   update reporting as new guidelines are published from the NKF-ASN   Task force.    GFR calc non Af Amer  Date Value Ref Range Status  07/07/2020 63 >59 mL/min/1.73 Final   eGFR  Date Value Ref Range Status  11/04/2021 76 >59 mL/min/1.73 Final          Passed - Patient is not pregnant      Passed - Valid encounter within last 12 months    Recent Outpatient Visits           2 weeks  ago Essential hypertension   [place], [place]   3 months ago Prediabetes   [place], Levada Dy M, [place]   10 months ago Essential hypertension   [place], Jarome Matin, RPH-CPP   10 months ago Essential hypertension   [place], Jarome Matin, RPH-CPP   11 months ago Essential hypertension   [place], RPH-CPP

## 2021-11-25 ENCOUNTER — Other Ambulatory Visit: Payer: Self-pay

## 2021-11-25 ENCOUNTER — Encounter: Payer: Self-pay | Admitting: Physician Assistant

## 2021-11-25 ENCOUNTER — Other Ambulatory Visit: Payer: Self-pay | Admitting: Nurse Practitioner

## 2021-11-25 ENCOUNTER — Ambulatory Visit (INDEPENDENT_AMBULATORY_CARE_PROVIDER_SITE_OTHER): Payer: No Typology Code available for payment source | Admitting: Physician Assistant

## 2021-11-25 VITALS — BP 140/90 | HR 72 | Ht 65.75 in | Wt 182.1 lb

## 2021-11-25 DIAGNOSIS — K5904 Chronic idiopathic constipation: Secondary | ICD-10-CM | POA: Diagnosis not present

## 2021-11-25 DIAGNOSIS — I10 Essential (primary) hypertension: Secondary | ICD-10-CM | POA: Diagnosis not present

## 2021-11-25 DIAGNOSIS — K219 Gastro-esophageal reflux disease without esophagitis: Secondary | ICD-10-CM | POA: Diagnosis not present

## 2021-11-25 DIAGNOSIS — Z1211 Encounter for screening for malignant neoplasm of colon: Secondary | ICD-10-CM

## 2021-11-25 MED ORDER — CLENPIQ 10-3.5-12 MG-GM -GM/160ML PO SOLN
320.0000 mL | Freq: Once | ORAL | 0 refills | Status: AC
  Filled 2021-11-25: qty 320, 1d supply, fill #0

## 2021-11-25 NOTE — Patient Instructions (Addendum)
If you are age 62 or older, your body mass index should be between 23-30. Your Body mass index is 29.62 kg/m. If this is out of the aforementioned range listed, please consider follow up with your Primary Care Provider.  If you are age 62 or younger, your body mass index should be between 19-25. Your Body mass index is 29.62 kg/m. If this is out of the aformentioned range listed, please consider follow up with your Primary Care Provider.   You have been scheduled for a colonoscopy. Please follow written instructions given to you at your visit today.  Please pick up your prep supplies at the pharmacy within the next 1-3 days. If you use inhalers (even only as needed), please bring them with you on the day of your procedure.   2 DAY COLONOSCOPY PREP WITH CLENPIQ and Miralax  In addition to purchasing Clenpiq at your pharmacy, please purchase the following over the counter: One 119 gram bottle of Miralax powder One box of Dulcolax Laxative 5 mg tablets (you will need 4 tablets)  One 32 oz. bottle of Gatorade (NO RED OR PURPLE)   2 DAYS BEFORE PROCEDURE :     DATE:  12/19/21  DAY: Tuesday  In the morning, mix the entire 119 gram bottle of Miralax with 32 oz Gatorade in a pitcher, mix until dissolved and refrigerate.    Eat a regular diet until start of prep in the evening, then clear liquids only.  Take 4 dulcolax tablets at 3 pm with water   Eat an early dinner between 4-6 pm   Between 5:00 pm and 7:00 pm, Drink 8 oz. of Miralax mixture every 15 minutes until gone. Once you have completed  this prep, you are ONLY allowed clear liquids until after the procedure     The [facility] providers would like to encourage you to use [facility] to communicate with providers for non-urgent requests or questions.  Due to long hold times on the telephone, sending your provider a message by [facility] may be a faster and more efficient way to get a response.  Please allow 48 business hours for a response.   Please remember that this is for non-urgent requests.   Recommend starting on a fiber supplement, can try metamucil first but if this causes gas/bloating switch to benefiber- Take with with a full 8 oz glass of water once a day. This can take 1 month to start helping, so try for at least one month.  Recommend increasing water and activity.  Get a squatty potty to use at home or try a stool, goal is to get your knees above your hips during a bowel movement.  Go to the ER if unable to pass gas, severe AB pain, unable to hold down food, any shortness of breath of chest pain.   Constipation, Adult Constipation is when a person has fewer than three bowel movements in a week, has difficulty having a bowel movement, or has stools (feces) that are dry, hard, or larger than normal. Constipation may be caused by an underlying condition. It may become worse with age if a person takes certain medicines and does not take in enough fluids. Follow these instructions at home: Eating and drinking  Eat foods that have a lot of fiber, such as beans, whole grains, and fresh fruits and vegetables. Limit foods that are low in fiber and high in fat and processed sugars, such as fried or sweet foods. These include french fries, hamburgers, cookies, candies,  and soda. Drink enough fluid to keep your urine pale yellow. General instructions Exercise regularly or as told by your health care provider. Try to do 150 minutes of moderate exercise each week. Use the bathroom when you have the urge to go. Do not hold it in. Take over-the-counter and prescription medicines only as told by your health care provider. This includes any fiber supplements. During bowel movements: Practice deep breathing while relaxing the lower abdomen. Practice pelvic floor relaxation. Watch your condition for any changes. Let your health care provider know about them. Keep all follow-up visits as told by your health care provider. This is  important. Contact a health care provider if: You have pain that gets worse. You have a fever. You do not have a bowel movement after 4 days. You vomit. You are not hungry or you lose weight. You are bleeding from the opening between the buttocks (anus). You have thin, pencil-like stools. Get help right away if: You have a fever and your symptoms suddenly get worse. You leak stool or have blood in your stool. Your abdomen is bloated. You have severe pain in your abdomen. You feel dizzy or you faint. Summary Constipation is when a person has fewer than three bowel movements in a week, has difficulty having a bowel movement, or has stools (feces) that are dry, hard, or larger than normal. Eat foods that have a lot of fiber, such as beans, whole grains, and fresh fruits and vegetables. Drink enough fluid to keep your urine pale yellow. Take over-the-counter and prescription medicines only as told by your health care provider. This includes any fiber supplements. This information is not intended to replace advice given to you by your health care provider. Make sure you discuss any questions you have with your health care provider. Document Revised: 08/08/2019 Document Reviewed: 08/08/2019 Elsevier Patient Education  2022 [employer] Inc.   Can take famotidine at night as needed  Avoid spicy and acidic foods Avoid fatty foods Limit your intake of coffee, tea, alcohol, and carbonated drinks Work to maintain a healthy weight Keep the head of the bed elevated at least 3 inches with blocks or a wedge pillow if you are having any nighttime symptoms Stay upright for 2 hours after eating Avoid meals and snacks three to four hours before bedtime  Gastroesophageal Reflux Disease, Adult Gastroesophageal reflux (GER) happens when acid from the stomach flows up into the tube that connects the mouth and the stomach (esophagus). Normally, food travels down the esophagus and stays in the stomach to be  digested. However, when a person has GER, food and stomach acid sometimes move back up into the esophagus. If this becomes a more serious problem, the person may be diagnosed with a disease called gastroesophageal reflux disease (GERD). GERD occurs when the reflux: Happens often. Causes frequent or severe symptoms. Causes problems such as damage to the esophagus. When stomach acid comes in contact with the esophagus, the acid may cause inflammation in the esophagus. Over time, GERD may create small holes (ulcers) in the lining of the esophagus. What are the causes? This condition is caused by a problem with the muscle between the esophagus and the stomach (lower esophageal sphincter, or LES). Normally, the LES muscle closes after food passes through the esophagus to the stomach. When the LES is weakened or abnormal, it does not close properly, and that allows food and stomach acid to go back up into the esophagus. The LES can be weakened by certain dietary  substances, medicines, and medical conditions, including: Tobacco use. Pregnancy. Having a hiatal hernia. Alcohol use. Certain foods and beverages, such as coffee, chocolate, onions, and peppermint. What increases the risk? You are more likely to develop this condition if you: Have an increased body weight. Have a connective tissue disorder. Take NSAIDs, such as ibuprofen. What are the signs or symptoms? Symptoms of this condition include: Heartburn. Difficult or painful swallowing and the feeling of having a lump in the throat. A bitter taste in the mouth. Bad breath and having a large amount of saliva. Having an upset or bloated stomach and belching. Chest pain. Different conditions can cause chest pain. Make sure you see your health care provider if you experience chest pain. Shortness of breath or wheezing. Ongoing (chronic) cough or a nighttime cough. Wearing away of tooth enamel. Weight loss. How is this diagnosed? This  condition may be diagnosed based on a medical history and a physical exam. To determine if you have mild or severe GERD, your health care provider may also monitor how you respond to treatment. You may also have tests, including: A test to examine your stomach and esophagus with a small camera (endoscopy). A test that measures the acidity level in your esophagus. A test that measures how much pressure is on your esophagus. A barium swallow or modified barium swallow test to show the shape, size, and functioning of your esophagus. How is this treated? Treatment for this condition may vary depending on how severe your symptoms are. Your health care provider may recommend: Changes to your diet. Medicine. Surgery. The goal of treatment is to help relieve your symptoms and to prevent complications. Follow these instructions at home: Eating and drinking  Follow a diet as recommended by your health care provider. This may involve avoiding foods and drinks such as: Coffee and tea, with or without caffeine. Drinks that contain alcohol. Energy drinks and sports drinks. Carbonated drinks or sodas. Chocolate and cocoa. Peppermint and mint flavorings. Garlic and onions. Horseradish. Spicy and acidic foods, including peppers, chili powder, curry powder, vinegar, hot sauces, and barbecue sauce. Citrus fruit juices and citrus fruits, such as oranges, lemons, and limes. Tomato-based foods, such as red sauce, chili, salsa, and pizza with red sauce. Fried and fatty foods, such as donuts, french fries, potato chips, and high-fat dressings. High-fat meats, such as hot dogs and fatty cuts of red and white meats, such as rib eye steak, sausage, ham, and bacon. High-fat dairy items, such as whole milk, butter, and cream cheese. Eat small, frequent meals instead of large meals. Avoid drinking large amounts of liquid with your meals. Avoid eating meals during the 2-3 hours before bedtime. Avoid lying down  right after you eat. Do not exercise right after you eat. Lifestyle  Do not use any products that contain nicotine or tobacco. These products include cigarettes, chewing tobacco, and vaping devices, such as e-cigarettes. If you need help quitting, ask your health care provider. Try to reduce your stress by using methods such as yoga or meditation. If you need help reducing stress, ask your health care provider. If you are overweight, reduce your weight to an amount that is healthy for you. Ask your health care provider for guidance about a safe weight loss goal. General instructions Pay attention to any changes in your symptoms. Take over-the-counter and prescription medicines only as told by your health care provider. Do not take aspirin, ibuprofen, or other NSAIDs unless your health care provider told you to take  these medicines. Wear loose-fitting clothing. Do not wear anything tight around your waist that causes pressure on your abdomen. Raise (elevate) the head of your bed about 6 inches (15 cm). You can use a wedge to do this. Avoid bending over if this makes your symptoms worse. Keep all follow-up visits. This is important. Contact a health care provider if: You have: New symptoms. Unexplained weight loss. Difficulty swallowing or it hurts to swallow. Wheezing or a persistent cough. A hoarse voice. Your symptoms do not improve with treatment. Get help right away if: You have sudden pain in your arms, neck, jaw, teeth, or back. You suddenly feel sweaty, dizzy, or light-headed. You have chest pain or shortness of breath. You vomit and the vomit is green, yellow, or black, or it looks like blood or coffee grounds. You faint. You have stool that is red, bloody, or black. You cannot swallow, drink, or eat. These symptoms may represent a serious problem that is an emergency. Do not wait to see if the symptoms will go away. Get medical help right away. Call your local emergency services  (911 in the [place] Do not drive yourself to the hospital. Summary Gastroesophageal reflux happens when acid from the stomach flows up into the esophagus. GERD is a disease in which the reflux happens often, causes frequent or severe symptoms, or causes problems such as damage to the esophagus. Treatment for this condition may vary depending on how severe your symptoms are. Your health care provider may recommend diet and lifestyle changes, medicine, or surgery. Contact a health care provider if you have new or worsening symptoms. Take over-the-counter and prescription medicines only as told by your health care provider. Do not take aspirin, ibuprofen, or other NSAIDs unless your health care provider told you to do so. Keep all follow-up visits as told by your health care provider. This is important. This information is not intended to replace advice given to you by your health care provider. Make sure you discuss any questions you have with your health care provider. Document Revised: 03/31/2020 Document Reviewed: 03/31/2020 Elsevier Patient Education  2022 [employer].  It was a pleasure to see you today!  Thank you for trusting me with your gastrointestinal care!    Quentin Mulling, PA-C

## 2021-11-25 NOTE — Progress Notes (Signed)
Agree with the assessment and plan as outlined by Amanda Collier, PA-C. ? ?Christella App, DO, FACG ? ?

## 2021-11-26 ENCOUNTER — Other Ambulatory Visit: Payer: Self-pay

## 2021-11-27 ENCOUNTER — Other Ambulatory Visit: Payer: Self-pay

## 2021-12-03 ENCOUNTER — Ambulatory Visit: Payer: Self-pay | Admitting: *Deleted

## 2021-12-03 ENCOUNTER — Other Ambulatory Visit: Payer: Self-pay

## 2021-12-03 NOTE — Telephone Encounter (Signed)
Summary: appt for arm pain due to ijury from work  ? Pt called to make an appt to be seen for injury to her left arm.  Her employer put her out of work until she can be seen.  She said she was in back in dec and seen Dr. Delford Field  for the same thing.    ? ?She said they have her out until Tuesday and she is asking to be seen asap  ? ?CB@  [phone]   ?  ? ?Reason for Disposition ? [1] SEVERE pain AND [2] not improved 2 hours after pain medicine/ice packs ? ?Answer Assessment - Initial Assessment Questions ?1. ONSET: "When did the pain start?" ?    Larey Seat at work- December ?2. LOCATION: "Where is the pain located?" ?    L-wrist and elbow ?3. PAIN: "How bad is the pain?" (Scale 1-10; or mild, moderate, severe) ?  - MILD (1-3): doesn't interfere with normal activities ?  - MODERATE (4-7): interferes with normal activities (e.g., work or school) or awakens from sleep ?  - SEVERE (8-10): excruciating pain, unable to do any normal activities, unable to hold a cup of water ?    Severe ?4. WORK OR EXERCISE: "Has there been any recent work or exercise that involved this part of the body?" ?    Fell at work ?5. CAUSE: "What do you think is causing the arm pain?" ?    injury ?6. OTHER SYMPTOMS: "Do you have any other symptoms?" (e.g., neck pain, swelling, rash, fever, numbness, weakness) ?    Hard to turn head to L ?7. PREGNANCY: "Is there any chance you are pregnant?" "When was your last menstrual period?" ?    *No Answer* ? ?Protocols used: Arm Pain-A-AH, Arm Injury-A-AH ? ?

## 2021-12-03 NOTE — Telephone Encounter (Signed)
?  Chief Complaint: arm pain- injury- patient is upset she has not received referral for follow up to her injury- she states she did not ask provider for [facility] referral- she was not using that as her work place was unresponsive to her injury. ?Symptoms: pain in arm- severe ?Frequency: since fall at work in December ?Pertinent Negatives: Patient denies swelling, rash, fever, weakness ?Disposition: [] ED /[x] Urgent Care (no appt availability in office) / [] Appointment(In office/virtual)/ []  [facility]/ [] Home Care/ [] Refused Recommended Disposition /[] [facility]/ []  Follow-up with PCP ?Additional Notes: Patient advised no open appointment within disposition- advised options- UC or mobile unit provider at [facility]- patient declined options- she states she is going to be seen by lawyer recommendation today. She disconnected call.  ?

## 2021-12-04 NOTE — Telephone Encounter (Signed)
Attempt to schedule patient an appt for OV next for arm pain. She states she is at an appt.  ?Will keep appt in 12/30/2021.  ? ?

## 2021-12-15 ENCOUNTER — Telehealth: Payer: Self-pay | Admitting: Physician Assistant

## 2021-12-15 MED ORDER — CLENPIQ 10-3.5-12 MG-GM -GM/160ML PO SOLN: 1.0000 | ORAL | 0 refills | Status: DC

## 2021-12-15 NOTE — Telephone Encounter (Signed)
Inbound call from patient would like prep Clenpiq sent to [facility] on [address]. Patient would also like a call when sent  ?

## 2021-12-15 NOTE — Telephone Encounter (Signed)
Clenpiq sent to [facility]. Tried to contact patient, phone was answered and then hung up. ?

## 2021-12-21 ENCOUNTER — Encounter: Payer: No Typology Code available for payment source | Admitting: Gastroenterology

## 2021-12-30 ENCOUNTER — Ambulatory Visit: Payer: No Typology Code available for payment source | Attending: Physician Assistant | Admitting: Physician Assistant

## 2021-12-30 ENCOUNTER — Other Ambulatory Visit: Payer: Self-pay

## 2021-12-30 ENCOUNTER — Encounter: Payer: Self-pay | Admitting: Physician Assistant

## 2021-12-30 VITALS — BP 160/90 | HR 76 | Resp 16 | Wt 187.6 lb

## 2021-12-30 DIAGNOSIS — H6982 Other specified disorders of Eustachian tube, left ear: Secondary | ICD-10-CM

## 2021-12-30 DIAGNOSIS — I10 Essential (primary) hypertension: Secondary | ICD-10-CM

## 2021-12-30 DIAGNOSIS — R7303 Prediabetes: Secondary | ICD-10-CM

## 2021-12-30 MED ORDER — FLUTICASONE PROPIONATE 50 MCG/ACT NA SUSP
2.0000 | Freq: Every day | NASAL | 6 refills | Status: DC
  Filled 2021-12-30: qty 16, 30d supply, fill #0

## 2021-12-30 NOTE — Patient Instructions (Signed)
Please take all of your medications and check your blood pressure daily and record.   Take these numbers to your new PCP appt in April.   ? ?Hypertension, Adult ?Hypertension is another name for high blood pressure. High blood pressure forces your heart to work harder to pump blood. This can cause problems over time. ?There are two numbers in a blood pressure reading. There is a top number (systolic) over a bottom number (diastolic). It is best to have a blood pressure that is below 120/80. Healthy choices can help lower your blood pressure, or you may need medicine to help lower it. ?What are the causes? ?The cause of this condition is not known. Some conditions may be related to high blood pressure. ?What increases the risk? ?Smoking. ?Having type 2 diabetes mellitus, high cholesterol, or both. ?Not getting enough exercise or physical activity. ?Being overweight. ?Having too much fat, sugar, calories, or salt (sodium) in your diet. ?Drinking too much alcohol. ?Having long-term (chronic) kidney disease. ?Having a family history of high blood pressure. ?Age. Risk increases with age. ?Race. You may be at higher risk if you are African American. ?Gender. Men are at higher risk than women before age 38. After age 20, women are at higher risk than men. ?Having obstructive sleep apnea. ?Stress. ?What are the signs or symptoms? ?High blood pressure may not cause symptoms. Very high blood pressure (hypertensive crisis) may cause: ?Headache. ?Feelings of worry or nervousness (anxiety). ?Shortness of breath. ?Nosebleed. ?A feeling of being sick to your stomach (nausea). ?Throwing up (vomiting). ?Changes in how you see. ?Very bad chest pain. ?Seizures. ?How is this treated? ?This condition is treated by making healthy lifestyle changes, such as: ?Eating healthy foods. ?Exercising more. ?Drinking less alcohol. ?Your health care provider may prescribe medicine if lifestyle changes are not enough to get your blood pressure under  control, and if: ?Your top number is above 130. ?Your bottom number is above 80. ?Your personal target blood pressure may vary. ?Follow these instructions at home: ?Eating and drinking ? ?If told, follow the DASH eating plan. To follow this plan: ?Fill one half of your plate at each meal with fruits and vegetables. ?Fill one fourth of your plate at each meal with whole grains. Whole grains include whole-wheat pasta, brown rice, and whole-grain bread. ?Eat or drink low-fat dairy products, such as skim milk or low-fat yogurt. ?Fill one fourth of your plate at each meal with low-fat (lean) proteins. Low-fat proteins include fish, chicken without skin, eggs, beans, and tofu. ?Avoid fatty meat, cured and processed meat, or chicken with skin. ?Avoid pre-made or processed food. ?Eat less than 1,500 mg of salt each day. ?Do not drink alcohol if: ?Your doctor tells you not to drink. ?You are pregnant, may be pregnant, or are planning to become pregnant. ?If you drink alcohol: ?Limit how much you use to: ?0-1 drink a day for women. ?0-2 drinks a day for men. ?Be aware of how much alcohol is in your drink. In the [place] one drink equals one 12 oz bottle of beer (355 mL), one 5 oz glass of wine (148 mL), or one 1? oz glass of hard liquor (44 mL). ?Lifestyle ? ?Work with your doctor to stay at a healthy weight or to lose weight. Ask your doctor what the best weight is for you. ?Get at least 30 minutes of exercise most days of the week. This may include walking, swimming, or biking. ?Get at least 30 minutes of exercise that strengthens  your muscles (resistance exercise) at least 3 days a week. This may include lifting weights or doing Pilates. ?Do not use any products that contain nicotine or tobacco, such as cigarettes, e-cigarettes, and chewing tobacco. If you need help quitting, ask your doctor. ?Check your blood pressure at home as told by your doctor. ?Keep all follow-up visits as told by your doctor. This is  important. ?Medicines ?Take over-the-counter and prescription medicines only as told by your doctor. Follow directions carefully. ?Do not skip doses of blood pressure medicine. The medicine does not work as well if you skip doses. Skipping doses also puts you at risk for problems. ?Ask your doctor about side effects or reactions to medicines that you should watch for. ?Contact a doctor if you: ?Think you are having a reaction to the medicine you are taking. ?Have headaches that keep coming back (recurring). ?Feel dizzy. ?Have swelling in your ankles. ?Have trouble with your vision. ?Get help right away if you: ?Get a very bad headache. ?Start to feel mixed up (confused). ?Feel weak or numb. ?Feel faint. ?Have very bad pain in your: ?Chest. ?Belly (abdomen). ?Throw up more than once. ?Have trouble breathing. ?Summary ?Hypertension is another name for high blood pressure. ?High blood pressure forces your heart to work harder to pump blood. ?For most people, a normal blood pressure is less than 120/80. ?Making healthy choices can help lower blood pressure. If your blood pressure does not get lower with healthy choices, you may need to take medicine. ?This information is not intended to replace advice given to you by your health care provider. Make sure you discuss any questions you have with your health care provider. ?Document Revised: 05/31/2018 Document Reviewed: 05/31/2018 ?Elsevier Patient Education ? [place]. ? ?

## 2021-12-30 NOTE — Progress Notes (Signed)
Patient ID: Shelby Rojas, female   DOB: Feb 09, 1961, 61 y.o.   MRN: [id number] ? ? ? ?Shelby Rojas, is a 61 y.o. female ? ?[id number] ? ?[id number] ? ?DOB - Oct 02, 1961 ? ?No chief complaint on file. ?    ? ?Subjective:  ? ?Shelby Rojas is a 61 y.o. female here today for a follow up visit for BP.  She says when she checks her blood pressure out of the office it is ~120/98, but she did not bring in a list of numbers.  She also says she did not take her meds last night.  She did not bring any bottles with her and is unable to verify what she is taking.  (Her med list she has not picked up spironolactone.) ? ?She is also asking me for a note "back to work bc of blood pressure."  It is unclear to me who put her out of work.  She says her job did ? ?Patient has No headache, No chest pain, No abdominal pain - No Nausea, No new weakness tingling or numbness, No Cough - SOB. ? ?No problems updated. ? ?ALLERGIES: ?Allergies  ?Allergen Reactions  ? Codeine Anaphylaxis  ? Flexeril [Cyclobenzaprine] Anaphylaxis  ?  Pt  States  She  Is  Allergic  To  Muscle  relaxant  ? ? ?PAST MEDICAL HISTORY: ?Past Medical History:  ?Diagnosis Date  ? Anxiety   ? Arthritis   ? Chest pain   ? Cocaine use disorder ([place])   ? Diastolic dysfunction without heart failure   ? GERD (gastroesophageal reflux disease)   ? High cholesterol   ? Hypertension   ? Obesity   ? Panic attacks   ? PNA (pneumonia)   ? Prediabetes   ? ? ?MEDICATIONS AT HOME: ?Prior to Admission medications   ?Medication Sig Start Date End Date Taking? Authorizing Provider  ?fluticasone (FLONASE) 50 MCG/ACT nasal spray Place 2 sprays into both nostrils daily. 12/30/21  Yes Argentina Donovan, PA-C  ?amLODipine (NORVASC) 10 MG tablet Take 1 tablet (10 mg total) by mouth daily. 11/04/21 02/02/22  Elsie Stain, MD  ?atorvastatin (LIPITOR) 40 MG tablet TAKE 1 TABLET (40 MG TOTAL) BY MOUTH DAILY. 11/04/21 11/04/22  Elsie Stain, MD  ?carvedilol (COREG) 12.5 MG tablet Take 1  tablet (12.5 mg total) by mouth 2 (two) times daily with a meal. 11/04/21   Elsie Stain, MD  ?gabapentin (NEURONTIN) 300 MG capsule Take 1 capsule (300 mg total) by mouth 3 (three) times daily. 11/04/21 11/04/22  Elsie Stain, MD  ?Sod Picosulfate-Mag Ox-Cit Acd (CLENPIQ) 10-3.5-12 MG-GM -GM/160ML SOLN Take 1 kit by mouth as directed. 12/15/21   Vladimir Crofts, PA-C  ?spironolactone (ALDACTONE) 25 MG tablet Take 1 tablet (25 mg total) by mouth daily. 11/04/21   Elsie Stain, MD  ?valsartan-hydrochlorothiazide (DIOVAN-HCT) 320-25 MG tablet Take 1 tablet by mouth daily. 11/04/21   Elsie Stain, MD  ?losartan (COZAAR) 100 MG tablet Take 1 tablet (100 mg total) by mouth daily. 07/07/20 10/30/20  Gildardo Pounds, NP  ? ? ?ROS: ?Neg HEENT ?Neg resp ?Neg cardiac ?Neg GI ?Neg GU ?Neg MS ?Neg psych ?Neg neuro ? ?Objective:  ? ?Vitals:  ? 12/30/21 1105  ?BP: (!) 160/90  ?Pulse: 76  ?Resp: 16  ?SpO2: 98%  ?Weight: 187 lb 9.6 oz (85.1 kg)  ? ?Exam ?General appearance : Awake, alert, not in any distress. Speech Clear. Not toxic looking ?HEENT: Atraumatic and Normocephalic ?Neck: Supple, no  JVD. No cervical lymphadenopathy.  ?Chest: Good air entry bilaterally, CTAB.  No rales/rhonchi/wheezing ?CVS: S1 S2 regular, no murmurs.  ?Extremities: B/L Lower Ext shows no edema, both legs are warm to touch ?Neurology: Awake alert, and oriented X 3, CN II-XII intact, Non focal ?Skin: No Rash ? ?Data Review ?Lab Results  ?Component Value Date  ? HGBA1C 6.1 (H) 11/04/2021  ? HGBA1C 6.4 (H) 07/07/2020  ? HGBA1C 6.4 (H) 10/03/2019  ? ? ?Assessment & Plan  ? ?1. Dysfunction of left eustachian tube ?- fluticasone (FLONASE) 50 MCG/ACT nasal spray; Place 2 sprays into both nostrils daily.  Dispense: 16 g; Refill: 6 ? ?2. Essential hypertension ?Uncontrolled-compliance unclear.  Continue current regimen.  I did not write her a note out of work or back to work.  I wrote a note stating she was under medical care and on medications for  blood pressure and could work but should not lift >15 pounds.   ? ?3. Prediabetes ?I have had a lengthy discussion and provided education about insulin resistance and the intake of too much sugar/refined carbohydrates.  I have advised the patient to work at a goal of eliminating sugary drinks, candy, desserts, sweets, refined sugars, processed foods, and white carbohydrates.  The patient expresses understanding.  ? ? ?Patient have been counseled extensively about nutrition and exercise. Other issues discussed during this visit include: low cholesterol diet, weight control and daily exercise, foot care, annual eye examinations at Ophthalmology, importance of adherence with medications and regular follow-up. We also discussed long term complications of uncontrolled diabetes and hypertension.  ? ?Return for patient is changing PCP to a different office.  I advised her to record blood pressures and take those and her meds to her new PCP appt.   ? ?The patient was given clear instructions to go to ER or return to medical center if symptoms don't improve, worsen or new problems develop. The patient verbalized understanding. The patient was told to call to get lab results if they haven't heard anything in the next week.  ? ? ? ? ?Freeman Caldron, PA-C ?[facility] ?[place], [place] ?[phone]   ?12/30/2021, 11:48 AM  ?

## 2022-01-06 ENCOUNTER — Other Ambulatory Visit: Payer: Self-pay

## 2022-01-13 ENCOUNTER — Ambulatory Visit (INDEPENDENT_AMBULATORY_CARE_PROVIDER_SITE_OTHER): Payer: No Typology Code available for payment source | Admitting: Obstetrics

## 2022-01-13 ENCOUNTER — Other Ambulatory Visit (HOSPITAL_COMMUNITY)
Admission: RE | Admit: 2022-01-13 | Discharge: 2022-01-13 | Disposition: A | Discharge status: Home / Self Care | Payer: No Typology Code available for payment source | Source: Ambulatory Visit | Attending: Obstetrics | Admitting: Obstetrics

## 2022-01-13 ENCOUNTER — Encounter: Payer: Self-pay | Admitting: Obstetrics

## 2022-01-13 VITALS — BP 187/91 | HR 74 | Ht 69.0 in | Wt 191.0 lb

## 2022-01-13 DIAGNOSIS — Z780 Asymptomatic menopausal state: Secondary | ICD-10-CM | POA: Diagnosis not present

## 2022-01-13 DIAGNOSIS — Z01419 Encounter for gynecological examination (general) (routine) without abnormal findings: Secondary | ICD-10-CM | POA: Diagnosis not present

## 2022-01-13 NOTE — Progress Notes (Signed)
? ?Subjective: ? ? ?  ?  ? Shelby Rojas is a 61 y.o. female here for a routine exam.  Current complaints: None.   ? ?Personal health questionnaire:  ?Is patient Ashkenazi Jewish, have a family history of breast and/or ovarian cancer: yes ?Is there a family history of uterine cancer diagnosed at age < 6, gastrointestinal cancer, urinary tract cancer, family member who is a Field seismologist syndrome-associated carrier: no ?Is the patient overweight and hypertensive, family history of diabetes, personal history of gestational diabetes, preeclampsia or PCOS: no ?Is patient over 1, have PCOS,  family history of premature CHD under age 41, diabetes, smoke, have hypertension or peripheral artery disease:  no ?At any time, has a partner hit, kicked or otherwise hurt or frightened you?: no ?Over the past 2 weeks, have you felt down, depressed or hopeless?: no ?Over the past 2 weeks, have you felt little interest or pleasure in doing things?:no ? ? ?Gynecologic History ?No LMP recorded. Patient is postmenopausal. ?Contraception: post menopausal status ?Last Pap: 2020. Results were: normal ?Last mammogram: 2023. Results were: normal ? ?Obstetric History ?OB History  ?Gravida Para Term Preterm AB Living  ?_0 ?SAB IAB Ectopic Multiple Live Births  ?           ?  ?# Outcome Date GA Lbr Len/2nd Weight Sex Delivery Anes PTL Lv  ?4 Gravida           ?3 AB           ?2 AB           ?1 AB           ? ? ?Past Medical History:  ?Diagnosis Date  ? Anxiety   ? Arthritis   ? Chest pain   ? Cocaine use disorder ([place])   ? Diastolic dysfunction without heart failure   ? GERD (gastroesophageal reflux disease)   ? High cholesterol   ? Hypertension   ? Obesity   ? Panic attacks   ? PNA (pneumonia)   ? Prediabetes   ? Vaginal Pap smear, abnormal   ?  ?Past Surgical History:  ?Procedure Laterality Date  ? CESAREAN SECTION    ? CORONARY ANGIOPLASTY WITH STENT PLACEMENT    ?  ? ?Current Outpatient Medications:  ?  amLODipine (NORVASC) 10 MG  tablet, Take 1 tablet (10 mg total) by mouth daily., Disp: 30 tablet, Rfl: 2 ?  atorvastatin (LIPITOR) 40 MG tablet, TAKE 1 TABLET (40 MG TOTAL) BY MOUTH DAILY., Disp: 90 tablet, Rfl: 2 ?  carvedilol (COREG) 12.5 MG tablet, Take 1 tablet (12.5 mg total) by mouth 2 (two) times daily with a meal., Disp: 60 tablet, Rfl: 3 ?  fluticasone (FLONASE) 50 MCG/ACT nasal spray, Place 2 sprays into both nostrils daily., Disp: 16 g, Rfl: 6 ?  gabapentin (NEURONTIN) 300 MG capsule, Take 1 capsule (300 mg total) by mouth 3 (three) times daily., Disp: 90 capsule, Rfl: 3 ?  Sod Picosulfate-Mag Ox-Cit Acd (CLENPIQ) 10-3.5-12 MG-GM -GM/160ML SOLN, Take 1 kit by mouth as directed., Disp: 320 mL, Rfl: 0 ?  spironolactone (ALDACTONE) 25 MG tablet, Take 1 tablet (25 mg total) by mouth daily., Disp: 30 tablet, Rfl: 3 ?  valsartan-hydrochlorothiazide (DIOVAN-HCT) 320-25 MG tablet, Take 1 tablet by mouth daily., Disp: 90 tablet, Rfl: 3 ?Allergies  ?Allergen Reactions  ? Codeine Anaphylaxis  ? Flexeril [Cyclobenzaprine] Anaphylaxis  ?  Pt  States  She  Is  Allergic  To  Muscle  relaxant  ?  ?Social History  ? ?Tobacco Use  ? Smoking status: Former  ?  Types: Cigarettes  ?  Quit date: 2016  ?  Years since quitting: 7.2  ? Smokeless tobacco: Never  ?Substance Use Topics  ? Alcohol use: Yes  ?  Comment: occasional wine  ?  ?Family History  ?Problem Relation Age of Onset  ? Arthritis Mother   ? Prostate cancer Father   ? Hypertension Father   ? Hypertension Sister   ? Anemia Sister   ? Breast cancer Paternal Grandmother   ? Migraines Daughter   ?  ? ? ?Review of Systems ? ?Constitutional: negative for fatigue and weight loss ?Respiratory: negative for cough and wheezing ?Cardiovascular: negative for chest pain, fatigue and palpitations ?Gastrointestinal: negative for abdominal pain and change in bowel habits ?Musculoskeletal:negative for myalgias ?Neurological: negative for gait problems and tremors ?Behavioral/Psych: negative for abusive  relationship, depression ?Endocrine: negative for temperature intolerance    ?Genitourinary:negative for abnormal menstrual periods, genital lesions, hot flashes, sexual problems and vaginal discharge ?Integument/breast: negative for breast lump, breast tenderness, nipple discharge and skin lesion(s) ? ?  ?Objective:  ? ?    ?BP (!) 187/91   Pulse 74   Ht _0  (1.753 m)   Wt 191 lb (86.6 kg)   BMI 28.21 kg/m?  ?General:   Alert and no distress  ?Skin:   no rash or abnormalities  ?Lungs:   clear to auscultation bilaterally  ?Heart:   regular rate and rhythm, S1, S2 normal, no murmur, click, rub or gallop  ?Breasts:   normal without suspicious masses, skin or nipple changes or axillary nodes  ?Abdomen:  normal findings: no organomegaly, soft, non-tender and no hernia  ?Pelvis:  External genitalia: normal general appearance ?Urinary system: urethral meatus normal and bladder without fullness, nontender ?Vaginal: normal without tenderness, induration or masses ?Cervix: posterior, stenosed, bled easily with pap ?Adnexa: normal bimanual exam ?Uterus: anteverted and non-tender, normal size  ? ?Lab Review ?Urine pregnancy test ?Labs reviewed yes ?Radiologic studies reviewed yes ? ?I have spent a total of 20 minutes of face-to-face time, excluding clinical staff time, reviewing notes and preparing to see patient, ordering tests and/or medications, and counseling the patient.  ? ?Assessment:  ? ? 1. Encounter for gynecological examination with Papanicolaou smear of cervix ?Rx: ?- Cytology - PAP( [facility]) ? ?2. Postmenopausal ?- doing well ?  ?  ?Plan:  ? ? Education reviewed: calcium supplements, depression evaluation, low fat, low cholesterol diet, safe sex/STD prevention, self breast exams, and weight bearing exercise. ?Follow up in: 1 year.  ? ? ? ? Shelly Bombard, MD ?01/13/2022 3:00 PM  ?

## 2022-01-13 NOTE — Progress Notes (Signed)
61 y.o New GYN presents for PAP. Last PAP was 11 years ago. ? ?Last Mammogram 11/2021 Negative. ?

## 2022-01-15 LAB — CYTOLOGY - PAP
Adequacy: ABSENT
Comment: NEGATIVE
Diagnosis: NEGATIVE
High risk HPV: NEGATIVE

## 2022-01-18 ENCOUNTER — Telehealth: Payer: Self-pay | Admitting: Gastroenterology

## 2022-01-18 NOTE — Telephone Encounter (Signed)
Ok. Thank you for the update

## 2022-01-18 NOTE — Telephone Encounter (Signed)
Patient called this morning to cancel her procedure for tomorrow.  She went to see her GYN Friday and he said that her cervix was sitting too far back and they were waiting on other cultures to come in and thought it would be best for her to put off her colonoscopy for a few weeks.  She has rescheduled for 5/12. ?

## 2022-01-19 ENCOUNTER — Encounter: Payer: No Typology Code available for payment source | Admitting: Gastroenterology

## 2022-02-11 ENCOUNTER — Emergency Department (HOSPITAL_COMMUNITY): Payer: No Typology Code available for payment source

## 2022-02-11 ENCOUNTER — Other Ambulatory Visit: Payer: Self-pay

## 2022-02-11 ENCOUNTER — Encounter (HOSPITAL_COMMUNITY): Payer: Self-pay

## 2022-02-11 ENCOUNTER — Observation Stay (HOSPITAL_COMMUNITY)
Admission: EM | Admit: 2022-02-11 | Discharge: 2022-02-12 | Disposition: A | Discharge status: Home / Self Care | Payer: No Typology Code available for payment source | Attending: Family Medicine | Admitting: Family Medicine

## 2022-02-11 DIAGNOSIS — Z87891 Personal history of nicotine dependence: Secondary | ICD-10-CM | POA: Diagnosis not present

## 2022-02-11 DIAGNOSIS — R519 Headache, unspecified: Secondary | ICD-10-CM | POA: Diagnosis present

## 2022-02-11 DIAGNOSIS — I10 Essential (primary) hypertension: Secondary | ICD-10-CM | POA: Diagnosis not present

## 2022-02-11 DIAGNOSIS — Z955 Presence of coronary angioplasty implant and graft: Secondary | ICD-10-CM | POA: Insufficient documentation

## 2022-02-11 DIAGNOSIS — Z79899 Other long term (current) drug therapy: Secondary | ICD-10-CM | POA: Diagnosis not present

## 2022-02-11 DIAGNOSIS — E785 Hyperlipidemia, unspecified: Secondary | ICD-10-CM | POA: Diagnosis not present

## 2022-02-11 DIAGNOSIS — Z7982 Long term (current) use of aspirin: Secondary | ICD-10-CM | POA: Insufficient documentation

## 2022-02-11 DIAGNOSIS — G5603 Carpal tunnel syndrome, bilateral upper limbs: Secondary | ICD-10-CM | POA: Diagnosis not present

## 2022-02-11 DIAGNOSIS — I161 Hypertensive emergency: Principal | ICD-10-CM | POA: Diagnosis present

## 2022-02-11 DIAGNOSIS — E876 Hypokalemia: Secondary | ICD-10-CM | POA: Diagnosis not present

## 2022-02-11 DIAGNOSIS — E782 Mixed hyperlipidemia: Secondary | ICD-10-CM | POA: Diagnosis not present

## 2022-02-11 DIAGNOSIS — R7303 Prediabetes: Secondary | ICD-10-CM

## 2022-02-11 HISTORY — DX: Hypertensive emergency: I16.1

## 2022-02-11 LAB — CBC
HCT: 42.9 % (ref 36.0–46.0)
Hemoglobin: 14.1 g/dL (ref 12.0–15.0)
MCH: 29.4 pg (ref 26.0–34.0)
MCHC: 32.9 g/dL (ref 30.0–36.0)
MCV: 89.4 fL (ref 80.0–100.0)
Platelets: 262 10*3/uL (ref 150–400)
RBC: 4.8 MIL/uL (ref 3.87–5.11)
RDW: 12.4 % (ref 11.5–15.5)
WBC: 10 10*3/uL (ref 4.0–10.5)
nRBC: 0 % (ref 0.0–0.2)

## 2022-02-11 LAB — COMPREHENSIVE METABOLIC PANEL
ALT: 19 U/L (ref 0–44)
AST: 21 U/L (ref 15–41)
Albumin: 3.8 g/dL (ref 3.5–5.0)
Alkaline Phosphatase: 61 U/L (ref 38–126)
Anion gap: 8 (ref 5–15)
BUN: 11 mg/dL (ref 8–23)
CO2: 27 mmol/L (ref 22–32)
Calcium: 9.1 mg/dL (ref 8.9–10.3)
Chloride: 105 mmol/L (ref 98–111)
Creatinine, Ser: 0.98 mg/dL (ref 0.44–1.00)
GFR, Estimated: >60 mL/min (ref >60)
Glucose, Bld: 104 mg/dL — ABNORMAL HIGH (ref 70–99)
Potassium: 3.1 mmol/L — ABNORMAL LOW (ref 3.5–5.1)
Sodium: 140 mmol/L (ref 135–145)
Total Bilirubin: 0.6 mg/dL (ref 0.3–1.2)
Total Protein: 7 g/dL (ref 6.5–8.1)

## 2022-02-11 LAB — TROPONIN I (HIGH SENSITIVITY)
Troponin I (High Sensitivity): 40 ng/L — ABNORMAL HIGH (ref <18)
Troponin I (High Sensitivity): 64 ng/L — ABNORMAL HIGH (ref <18)

## 2022-02-11 LAB — RAPID URINE DRUG SCREEN, HOSP PERFORMED
Amphetamines: NOT DETECTED
Barbiturates: NOT DETECTED
Benzodiazepines: NOT DETECTED
Cocaine: NOT DETECTED
Opiates: NOT DETECTED
Tetrahydrocannabinol: NOT DETECTED

## 2022-02-11 LAB — BRAIN NATRIURETIC PEPTIDE: B Natriuretic Peptide: 35.2 pg/mL (ref 0.0–100.0)

## 2022-02-11 MED ORDER — AMLODIPINE BESYLATE 5 MG PO TABS
10.0000 mg | ORAL_TABLET | Freq: Once | ORAL | Status: AC
  Administered 2022-02-11: 10 mg via ORAL
  Filled 2022-02-11: qty 2

## 2022-02-11 MED ORDER — AMLODIPINE BESYLATE 10 MG PO TABS: 10.0000 mg | ORAL_TABLET | Freq: Once | ORAL | Status: DC

## 2022-02-11 MED ORDER — CARVEDILOL 12.5 MG PO TABS
12.5000 mg | ORAL_TABLET | Freq: Two times a day (BID) | ORAL | Status: DC
  Administered 2022-02-11: 12.5 mg via ORAL
  Filled 2022-02-11: qty 1

## 2022-02-11 MED ORDER — POTASSIUM CHLORIDE 20 MEQ PO PACK
40.0000 meq | PACK | Freq: Once | ORAL | Status: AC
  Administered 2022-02-11: 40 meq via ORAL
  Filled 2022-02-11: qty 2

## 2022-02-11 MED ORDER — LABETALOL HCL 5 MG/ML IV SOLN
10.0000 mg | Freq: Once | INTRAVENOUS | Status: AC
Start: 2022-02-11 — End: 2022-02-11
  Administered 2022-02-11: 10 mg via INTRAVENOUS
  Filled 2022-02-11: qty 4

## 2022-02-11 MED ORDER — ACETAMINOPHEN 325 MG PO TABS: 650.0000 mg | ORAL_TABLET | Freq: Four times a day (QID) | ORAL | Status: DC | PRN

## 2022-02-11 MED ORDER — ACETAMINOPHEN 650 MG RE SUPP: 650.0000 mg | Freq: Four times a day (QID) | RECTAL | Status: DC | PRN

## 2022-02-11 MED ORDER — ACETAMINOPHEN 500 MG PO TABS
1000.0000 mg | ORAL_TABLET | Freq: Once | ORAL | Status: AC
  Administered 2022-02-11: 1000 mg via ORAL
  Filled 2022-02-11: qty 2

## 2022-02-11 MED ORDER — ENOXAPARIN SODIUM 40 MG/0.4ML IJ SOSY
40.0000 mg | PREFILLED_SYRINGE | Freq: Every day | INTRAMUSCULAR | Status: DC
  Administered 2022-02-12: 40 mg via SUBCUTANEOUS
  Filled 2022-02-11 (×2): qty 0.4

## 2022-02-11 MED ORDER — GABAPENTIN 300 MG PO CAPS
300.0000 mg | ORAL_CAPSULE | Freq: Three times a day (TID) | ORAL | Status: DC
  Administered 2022-02-11 – 2022-02-12 (×2): 300 mg via ORAL
  Filled 2022-02-11 (×2): qty 1

## 2022-02-11 MED ORDER — IRBESARTAN 75 MG PO TABS
37.5000 mg | ORAL_TABLET | Freq: Every day | ORAL | Status: DC
Start: 2022-02-12 — End: 2022-02-12
  Filled 2022-02-11: qty 0.5

## 2022-02-11 NOTE — Progress Notes (Addendum)
AMA NOTICE ?I was called to admit this patient for hypertensive emergency.   ? ?HPI ?She was at work this morning she developed an acute headache and dizziness, presented to the infirmary at work where she was found to have systolic blood pressures in the 250s per patient.  EMS was called.  Blood pressure still elevated upon arrival to the emergency room, systolics in the 220s. Patient has received amlodipine 10 mg x 1, carvedilol 2.5 mg x 1, and labetalol IV 10 mg x 1.  ? ?Current vitals ?Blood pressure (!) 208/103, pulse 77, temperature 98.9 ?F (37.2 ?C), temperature source Oral, resp. rate 20, height 5\' 8"  (1.727 m), weight 82.6 kg, SpO2 100 %.  ? ?Pertinent medical history ?She is currently on amlodipine 10 mg and metoprolol 12.5 mg twice daily at home; however she reports taking her medicines only 3 to 4 days/week.  She reports her PCP tried to place her on a new medication, but cannot recall the name.  She has not started the medication yet.  Chart indicates this is valsartan-hydrochlorothiazide 320-25 mg.  Significant medical history includes hospitalization years ago for hypertensive emergency.  ? ?AMA discussion ?Patient adamantly requests to go home despite the ED physician, attending physician, myself, her mother, and her daughter urging her to stay for treatment.  Patient was found to be ANO x4, could tell me why she was here in the hospital and the consequences of leaving without treatment.  I believe she has capacity to make decisions at this time.  Consequences discussed include stroke, heart attack, and death.  Despite multiple iterations of this conversation, she remains steadfast in her desire to leave AMA. ? ?RN notified, will bring AMA form for her to sign.  Discussed with patient close follow-up with her PCP and starting her new medication as prescribed.  Also reinforced to come back to the emergency room with any new symptoms or concerns. ? ? , MD ? ?

## 2022-02-11 NOTE — H&P (Signed)
Family Medicine Teaching Service ?Hospital Admission History and Physical ?Service Pager: [phone] ? ?Patient name: Shelby Rojas Medical record number: [id number] ?Date of birth: 07-05-1961 Age: 61 y.o. Gender: female ? ?Primary Care Provider: Pcp, No ?Consultants: None ?Code Status: Full ?Preferred Emergency Contact: daughter, Aiva Miskell ? ?Chief Complaint: dizziness, headache, elevated BP ? ?Assessment and Plan: ?Shelby Rojas is a 61 y.o. female presenting with hypertensive emergency . PMH is significant for hypertension, HLD, prediabetes. ? ?Hypertensive Emergency ?61 year old woman with a history of poorly controlled hypertension presents with headache, dizziness, and hypertensive emergency.  Pressure as high as 227/104 in the ED, she received Coreg 12.5 mg and amlodipine 10 mg as well as 1 dose of IV labetalol 10 mg.  CT head showed no acute abnormality.  CBC within normal limits.  CMP showed mild hypokalemia to 3.1, otherwise unremarkable with normal kidney function BNP normal at 35.2, troponin slightly elevated at 40, 64.  EKG shows normal sinus rhythm with right axis deviation with abnormal R wave progression but no acute ischemic findings. Urine toxicity shows no ingestions.  Chest x-ray shows a dextrocurvature of the spine but no acute disease process.  On exam patient has no neurofocal deficits.  Blood pressure is now much improved, 140s to 170s systolic over 90s diastolic. Suspect that this is likely due to nonadherence of medication and misunderstanding of the the diagnosis of hypertension and process of disease.  She likely is not taking all these medications and may not actually have resistant hypertension. Currently VSS and goal is to keep her out of range of hypertensive urgency without dropping her blood pressure too swiftly. We will try to avoid as needed labetalol/hydralazine in favor of long-acting agents. ?- Admit to [facility]PT[facility], Dr. Pollie Meyer attending ?- Continuous cardiac monitoring ?-  Goal SBP <200 and >140, Goal DBP <110 ?- Irbesartan 37 mg in the morning ?- Coreg 12.5 mg twice daily, amlodipine 10 mg daily ?- AM BMP ?- Plasma aldosterone renin concentration with ratio ?- Regular diet ?- Lovenox for DVT Ppx ? ?Hypokalemia ?K low to 3.1. Will replete orally. ?- AM BMP ? ?Carpal tunnel syndrome ?Has a history of carpal tunnel syndrome and takes gabapentin 300 mg 3 times daily. ?- Continue home medication ? ?Hyperlipidemia ?Patient was prescribed atorvastatin 40 mg daily.  She reports that she self discontinued this, and not for any problems with the medication, she just was not sure where she was taking it.  On chart review, last lipid panel was in 2021 which showed total cholesterol of 236, HDL 38, LDL 170. Patient had ASCVD risk of 23% documented in April of 2022, it is appropriate for her to be on high intensity statin.  We discussed the importance of statins as agents to prevent heart attack and stroke. ?- AM lipid panel ?- Restart high intensity statin based on ASCVD calculation using new lipid panel ? ?Prediabetes ?A1c in February 2023 was 6.1%. ?- Monitor on BMP ? ?FEN/GI: Regular diet ?Prophylaxis: Lovenox ? ?Disposition: progressive ? ?History of Present Illness:  Shelby Rojas is a 61 y.o. female presenting with headache, dizziness, elevated blood pressure. ? ?61 year old woman presents with complaint of headache, dizziness, and elevated blood pressure.  She has hypertensive disorder and at work today when the symptoms started she had her blood pressure taken which was systolic over 230.  The ambulance was called and she went to the emergency room where she was found to have a blood pressure of 227/104.  She has been  follow[facility], where she has been prescribed spironolactone 25 mg daily, amlodipine 10 mg daily, Coreg 12.5 mg twice daily, valsartan-HCTZ 320-25 mg daily. Her home medications are Coreg 12.5 mg twice daily, amlodipine 10 mg daily, valsartan-HCTZ  60-25 mg daily, spironolactone 25 mg daily.  She reports that she is not sure which medication she is supposed to be taking, and she never actually started the valsartan HCTZ. She takes amlodipine and Coreg maybe 3 to 4 days a week.   ? ?Is very anxious and when offered admission at first she declined and wanted to leave AMA however after thinking more about the circumstances with her daughter she decided to stay. ? ?Review Of Systems: Per HPI with the following additions:  ? ?Review of Systems  ?Respiratory:  Negative for chest tightness.   ?Cardiovascular:  Negative for chest pain and leg swelling.  ?Neurological:  Positive for dizziness, light-headedness and headaches. Negative for tremors, speech difficulty and weakness.  ?Psychiatric/Behavioral:  Negative for confusion and decreased concentration.   ?All other systems reviewed and are negative.  ? ?Patient Active Problem List  ? Diagnosis Date Noted  ? Hypertensive emergency 02/11/2022  ? Colon cancer screening 11/04/2021  ? Encounter for screening mammogram for malignant neoplasm of breast 11/04/2021  ? Prediabetes 11/04/2021  ? Pain of left upper extremity 11/04/2021  ? Impacted cerumen of both ears 11/04/2021  ? Encounter for health-related screening 11/04/2021  ? Hyperlipidemia 10/03/2019  ? Elevated random blood glucose level 10/03/2019  ? Well woman exam with routine gynecological exam 07/10/2019  ? Axillary lump, left 07/10/2019  ? Vaginal discharge 07/10/2019  ? Chest pain 08/12/2015  ? Essential hypertension 05/09/2013  ? ? ?Past Medical History: ?Past Medical History:  ?Diagnosis Date  ? Anxiety   ? Arthritis   ? Chest pain   ? Cocaine use disorder ([facility])   ? Diastolic dysfunction without heart failure   ? GERD (gastroesophageal reflux disease)   ? High cholesterol   ? Hypertension   ? Obesity   ? Panic attacks   ? PNA (pneumonia)   ? Prediabetes   ? Vaginal Pap smear, abnormal   ? ? ?Past Surgical History: ?Past Surgical History:  ?Procedure  Laterality Date  ? CESAREAN SECTION    ? CORONARY ANGIOPLASTY WITH STENT PLACEMENT    ? ? ?Social History: ?Social History  ? ?Tobacco Use  ? Smoking status: Former  ?  Types: Cigarettes  ?  Quit date: 2016  ?  Years since quitting: 7.3  ? Smokeless tobacco: Never  ?Vaping Use  ? Vaping Use: Never used  ?Substance Use Topics  ? Alcohol use: Yes  ?  Comment: occasional wine  ? Drug use: No  ? ?Additional social history: lives with her daughter, grandchild, and mother. Works for [employer].  ?Please also refer to relevant sections of EMR. ? ?Family History: ?Family History  ?Problem Relation Age of Onset  ? Arthritis Mother   ? Prostate cancer Father   ? Hypertension Father   ? Hypertension Sister   ? Anemia Sister   ? Breast cancer Paternal Grandmother   ? Migraines Daughter   ? ?Allergies and Medications: ?Allergies  ?Allergen Reactions  ? Codeine Anaphylaxis  ? Cyclobenzaprine Anaphylaxis, Swelling and Other (See Comments)  ?  The patient stated she is allergic to this.  ? ?No current facility-administered medications on file prior to encounter.  ? ?Current Outpatient Medications on File Prior to Encounter  ?Medication Sig Dispense Refill  ?  amLODipine (NORVASC) 10 MG tablet Take 1 tablet (10 mg total) by mouth daily. (Patient taking differently: Take 10 mg by mouth at bedtime.) 30 tablet 2  ? aspirin 325 MG EC tablet Take 325 mg by mouth as needed (for headaches).    ? ibuprofen (ADVIL) 200 MG tablet Take 200 mg by mouth every 6 (six) hours as needed for mild pain or headache.    ? losartan-hydrochlorothiazide (HYZAAR) 100-25 MG tablet Take 1 tablet by mouth at bedtime.    ? atorvastatin (LIPITOR) 40 MG tablet TAKE 1 TABLET (40 MG TOTAL) BY MOUTH DAILY. (Patient not taking: Reported on 02/11/2022) 90 tablet 2  ? carvedilol (COREG) 12.5 MG tablet Take 1 tablet (12.5 mg total) by mouth 2 (two) times daily with a meal. (Patient not taking: Reported on 02/11/2022) 60 tablet 3  ? fluticasone (FLONASE) 50 MCG/ACT nasal spray  Place 2 sprays into both nostrils daily. (Patient not taking: Reported on 02/11/2022) 16 g 6  ? gabapentin (NEURONTIN) 300 MG capsule Take 1 capsule (300 mg total) by mouth 3 (three) times daily. (Patient not

## 2022-02-11 NOTE — Progress Notes (Signed)
Family medicine teaching service will be admitting this patient. Our pager information can be located in the physician sticky notes, treatment team sticky notes, and the headers of all our official daily progress notes.   FAMILY MEDICINE TEACHING SERVICE Patient - Please contact intern pager [phone] or text page via website [email or web address] ([email or web address]) for questions regarding care. DO NOT page listed attending provider unless there is no answer from the number above.   Smith Mcnicholas, MD PGY-2, [facility] pager [phone]   

## 2022-02-11 NOTE — ED Triage Notes (Signed)
Pt bib [facility] from work where she was sitting at the desk and started to have a headache. Pt took 324mg  ASA and checked her blood pressure which was 260SBP. Pt does have hx of htn and has been taking her medications. Pt states she has had some anxiety and stressors in her life which is what she thinks is contributing to this. Pt arrives AOx4 and ambulatory. ?EMS vitals: 230/110, 76HR, 18R ?

## 2022-02-11 NOTE — ED Notes (Signed)
ED TO INPATIENT HANDOFF REPORT ? ?ED Nurse Name and Phone #: Sheria LangCameron [phone] ? ?S ?Name/Age/Gender ?Shelby Rojas ?61 y.o. ?female ?Room/Bed: 011C/011C ? ?Code Status ?  Code Status: Prior ? ?Home/SNF/Other ?Home ?Patient oriented to: self, place, time, and situation ?Is this baseline? Yes  ? ?Triage Complete: Triage complete  ?Chief Complaint ?Hypertensive emergency [I16.1] ? ?Triage Note ?Pt bib [facility] from work where she was sitting at the desk and started to have a headache. Pt took 324mg  ASA and checked her blood pressure which was 260SBP. Pt does have hx of htn and has been taking her medications. Pt states she has had some anxiety and stressors in her life which is what she thinks is contributing to this. Pt arrives AOx4 and ambulatory. ?EMS vitals: 230/110, 76HR, 18R  ? ?Allergies ?Allergies  ?Allergen Reactions  ? Codeine Anaphylaxis  ? Flexeril [Cyclobenzaprine] Anaphylaxis  ?  Pt  States  She  Is  Allergic  To  Muscle  relaxant  ? ? ?Level of Care/Admitting Diagnosis ?ED Disposition   ? ? ED Disposition  ?Admit  ? Condition  ?--  ? Comment  ?[facility] [[id number] ? Level of Care: Progressive [102] ? Admit to Progressive based on following criteria: NEUROLOGICAL AND NEUROSURGICAL complex patients with significant risk of instability, who do not meet ICU criteria, yet require close observation or frequent assessment (< / = every 2 - 4 hours) with medical / nursing intervention. ? [facility] if equivalent level of care is available:: No ? Covid Evaluation: Confirmed COVID Negative ? Diagnosis: Hypertensive emergency [phone][[id number]] ? Admitting Physician: Darral DashAMERON, MARISA [[id number]] ? Attending Physician: Deirdre PriestHAMBLISS, MARSHALL L [[id number]] ?  ?  ? ?  ? ? ?B ?Medical/Surgery History ?Past Medical History:  ?Diagnosis Date  ? Anxiety   ? Arthritis   ? Chest pain   ? Cocaine use disorder (HCC)   ? Diastolic dysfunction without heart failure    ? GERD (gastroesophageal reflux disease)   ? High cholesterol   ? Hypertension   ? Obesity   ? Panic attacks   ? PNA (pneumonia)   ? Prediabetes   ? Vaginal Pap smear, abnormal   ? ?Past Surgical History:  ?Procedure Laterality Date  ? CESAREAN SECTION    ? CORONARY ANGIOPLASTY WITH STENT PLACEMENT    ?  ? ?A ?IV Location/Drains/Wounds ?Patient Lines/Drains/Airways Status   ? ? Active Line/Drains/Airways   ? ? Name Placement date Placement time Site Days  ? Peripheral IV 02/11/22 20 G Right Antecubital 02/11/22  1313  Antecubital  less than 1  ? ?  ?  ? ?  ? ? ?Intake/Output Last 24 hours ?No intake or output data in the 24 hours ending 02/11/22 1648 ? ?Labs/Imaging ?Results for orders placed or performed during the hospital encounter of 02/11/22 (from the past 48 hour(s))  ?CBC     Status: None  ? Collection Time: 02/11/22  1:43 PM  ?Result Value Ref Range  ? WBC 10.0 4.0 - 10.5 K/uL  ? RBC 4.80 3.87 - 5.11 MIL/uL  ? Hemoglobin 14.1 12.0 - 15.0 g/dL  ? HCT 42.9 36.0 - 46.0 %  ? MCV 89.4 80.0 - 100.0 fL  ? MCH 29.4 26.0 - 34.0 pg  ? MCHC 32.9 30.0 - 36.0 g/dL  ? RDW 12.4 11.5 - 15.5 %  ? Platelets 262 150 - 400 K/uL  ? nRBC 0.0 0.0 - 0.2 %  ?  Comment: Performed at [facility], [address], [place], [place] [address]  ?Troponin I (High Sensitivity)     Status: Abnormal  ? Collection Time: 02/11/22  1:43 PM  ?Result Value Ref Range  ? Troponin I (High Sensitivity) 40 (H) <18 ng/L  ?  Comment: (NOTE) ?Elevated high sensitivity troponin I (hsTnI) values and significant  ?changes across serial measurements may suggest ACS but many other  ?chronic and acute conditions are known to elevate hsTnI results.  ?Refer to the "Links" section for chest pain algorithms and additional  ?guidance. ?Performed at [facility], [address] [place], [place] ?[address] ?  ?Comprehensive metabolic panel     Status: Abnormal  ? Collection Time: 02/11/22  1:43 PM  ?Result Value Ref Range  ? Sodium 140 135 - 145 mmol/L   ? Potassium 3.1 (L) 3.5 - 5.1 mmol/L  ? Chloride 105 98 - 111 mmol/L  ? CO2 27 22 - 32 mmol/L  ? Glucose, Bld 104 (H) 70 - 99 mg/dL  ?  Comment: Glucose reference range applies only to samples taken after fasting for at least 8 hours.  ? BUN 11 8 - 23 mg/dL  ? Creatinine, Ser 0.98 0.44 - 1.00 mg/dL  ? Calcium 9.1 8.9 - 10.3 mg/dL  ? Total Protein 7.0 6.5 - 8.1 g/dL  ? Albumin 3.8 3.5 - 5.0 g/dL  ? AST 21 15 - 41 U/L  ? ALT 19 0 - 44 U/L  ? Alkaline Phosphatase 61 38 - 126 U/L  ? Total Bilirubin 0.6 0.3 - 1.2 mg/dL  ? GFR, Estimated >60 >60 mL/min  ?  Comment: (NOTE) ?Calculated using the CKD-EPI Creatinine Equation (2021) ?  ? Anion gap 8 5 - 15  ?  Comment: Performed at [facility], [address] [place], [place] [address]  ?Rapid urine drug screen (hospital performed)     Status: None  ? Collection Time: 02/11/22  4:02 PM  ?Result Value Ref Range  ? Opiates NONE DETECTED NONE DETECTED  ? Cocaine NONE DETECTED NONE DETECTED  ? Benzodiazepines NONE DETECTED NONE DETECTED  ? Amphetamines NONE DETECTED NONE DETECTED  ? Tetrahydrocannabinol NONE DETECTED NONE DETECTED  ? Barbiturates NONE DETECTED NONE DETECTED  ?  Comment: (NOTE) ?DRUG SCREEN FOR MEDICAL PURPOSES ?ONLY.  IF CONFIRMATION IS NEEDED ?FOR ANY PURPOSE, NOTIFY LAB ?WITHIN 5 DAYS. ? ?LOWEST DETECTABLE LIMITS ?FOR URINE DRUG SCREEN ?Drug Class                     Cutoff (ng/mL) ?Amphetamine and metabolites    1000 ?Barbiturate and metabolites    200 ?Benzodiazepine                 200 ?Tricyclics and metabolites     300 ?Opiates and metabolites        300 ?Cocaine and metabolites        300 ?THC                            50 ?Performed at [facility], [address], [place], [place] ?[address] ?  ? ?CT Head Wo Contrast ? ?Result Date: 02/11/2022 ?CLINICAL DATA:  Headache EXAM: CT HEAD WITHOUT CONTRAST TECHNIQUE: Contiguous axial images were obtained from the base of the skull through the vertex without intravenous contrast. RADIATION DOSE  REDUCTION: This exam was performed according to the departmental dose-optimization program which  includes automated exposure control, adjustment of the mA and/or kV according to patient size and/or use of iterative reconstruction technique. COMPARISON:  Head CT dated November 14, 2013 FINDINGS: Brain: Chronic white matter ischemic change. Unchanged calcifications of the anterior and posterior falx. No evidence of acute infarction, hemorrhage, hydrocephalus, extra-axial collection or mass lesion/mass effect. Vascular: No hyperdense vessel or unexpected calcification. Skull: Normal. Negative for fracture or focal lesion. Sinuses/Orbits: No acute finding. Other: None. IMPRESSION: No acute intracranial abnormality. Electronically Signed   By: Allegra Lai M.D.   On: 02/11/2022 14:17  ? ?DG Chest Port 1 View ? ?Result Date: 02/11/2022 ?CLINICAL DATA:  Hypertension EXAM: PORTABLE CHEST 1 VIEW COMPARISON:  Chest x-ray dated July 26, 2017 FINDINGS: Cardiac and mediastinal contours are within normal limits for AP technique. Both lungs are clear. Dextrocurvature of the thoracic spine. IMPRESSION: No active disease. Electronically Signed   By: Allegra Lai M.D.   On: 02/11/2022 14:11   ? ?Pending Labs ?Unresulted Labs (From admission, onward)  ? ?  Start     Ordered  ? 02/11/22 1550  Brain natriuretic peptide  Once,   URGENT       ? 02/11/22 1549  ? ?  ?  ? ?  ? ? ?Vitals/Pain ?Today's Vitals  ? 02/11/22 1459 02/11/22 1500 02/11/22 1515 02/11/22 1640  ?BP:  (!) 200/106 (!) 164/148 (!) 208/103  ?Pulse:  72 (!) 101 77  ?Resp:    20  ?Temp:      ?TempSrc:      ?SpO2:  98% 100% 100%  ?Weight:      ?Height:      ?PainSc: 3      ? ? ?Isolation Precautions ?No active isolations ? ?Medications ?Medications  ?carvedilol (COREG) tablet 12.5 mg (12.5 mg Oral Given 02/11/22 1544)  ?amLODipine (NORVASC) tablet 10 mg (has no administration in time range)  ?acetaminophen (TYLENOL) tablet 1,000 mg (1,000 mg Oral Given 02/11/22 1416)   ?amLODipine (NORVASC) tablet 10 mg (10 mg Oral Given 02/11/22 1416)  ?labetalol (NORMODYNE) injection 10 mg (10 mg Intravenous Given 02/11/22 1544)  ? ? ?Mobility ?walks ?Low fall risk  ? ?Focused Assessme

## 2022-02-11 NOTE — ED Provider Notes (Signed)
?[facility] ?Provider Note ? ? ?CSN: [id number] ?Arrival date & time: 02/11/22  1302 ? ?  ? ?History ? ?Chief Complaint  ?Patient presents with  ? Hypertension  ? ? ?Shelby Rojas is a 61 y.o. female. ? ?The history is provided by the patient and medical records. No language interpreter was used.  ?Hypertension ? ? ?61 year old female significant history hypertension, hypercholesterolemia, GERD, polysubstance use, who presents via EMS complaining of headache.  Patient reports she was at work today, and while sitting at the computer she noticed that her vision was blurred follows with intense headache.  Headache described as a throbbing sensation across her head, persistent.  She went to her coworker to asked to have her blood pressure checked and at that time her blood pressure was reading greater than 983 systolic.  Will call EMS and patient was brought here for further assessment.  Since being here her headache is improving a bit but still present.  Vision has improved.  No associated fever no diplopia no cold symptoms no neck stiffness no chest pain trouble breathing back pain abdominal pain focal numbness or focal weakness or rash.  She denies any recent medication change or change in her diet.  She denies having increasing stress.  She normally takes her blood pressure medication at nighttime.  No prior history of CVA or heart attack.  She reports occasional tobacco use but denies any drugs or alcohol use.  She did mention her blood pressure usually runs a bit high around 382 systolic.  Her doctor did switch her to a different blood pressure medication but states she can continue to finish the rest of her current blood pressure medication before the switch. ? ?Home Medications ?Prior to Admission medications   ?Medication Sig Start Date End Date Taking? Authorizing Provider  ?amLODipine (NORVASC) 10 MG tablet Take 1 tablet (10 mg total) by mouth daily. 11/04/21 02/02/22   Elsie Stain, MD  ?atorvastatin (LIPITOR) 40 MG tablet TAKE 1 TABLET (40 MG TOTAL) BY MOUTH DAILY. 11/04/21 11/04/22  Elsie Stain, MD  ?carvedilol (COREG) 12.5 MG tablet Take 1 tablet (12.5 mg total) by mouth 2 (two) times daily with a meal. 11/04/21   Elsie Stain, MD  ?fluticasone (FLONASE) 50 MCG/ACT nasal spray Place 2 sprays into both nostrils daily. 12/30/21   Argentina Donovan, PA-C  ?gabapentin (NEURONTIN) 300 MG capsule Take 1 capsule (300 mg total) by mouth 3 (three) times daily. 11/04/21 11/04/22  Elsie Stain, MD  ?Sod Picosulfate-Mag Ox-Cit Acd (CLENPIQ) 10-3.5-12 MG-GM -GM/160ML SOLN Take 1 kit by mouth as directed. 12/15/21   Vladimir Crofts, PA-C  ?spironolactone (ALDACTONE) 25 MG tablet Take 1 tablet (25 mg total) by mouth daily. 11/04/21   Elsie Stain, MD  ?valsartan-hydrochlorothiazide (DIOVAN-HCT) 320-25 MG tablet Take 1 tablet by mouth daily. 11/04/21   Elsie Stain, MD  ?losartan (COZAAR) 100 MG tablet Take 1 tablet (100 mg total) by mouth daily. 07/07/20 10/30/20  Gildardo Pounds, NP  ?   ? ?Allergies    ?Codeine and Flexeril [cyclobenzaprine]   ? ?Review of Systems   ?Review of Systems  ?All other systems reviewed and are negative. ? ?Physical Exam ?Updated Vital Signs ?BP (!) 210/112   Pulse 74   Temp 98.9 ?F (37.2 ?C) (Oral)   Resp 20   Ht 5' 8"  (1.727 m)   Wt 82.6 kg   SpO2 100%   BMI 27.67 kg/m?  ?Physical  Exam ?Vitals and nursing note reviewed.  ?Constitutional:   ?   General: She is not in acute distress. ?   Appearance: She is well-developed.  ?HENT:  ?   Head: Atraumatic.  ?Eyes:  ?   Extraocular Movements: Extraocular movements intact.  ?   Conjunctiva/sclera: Conjunctivae normal.  ?   Pupils: Pupils are equal, round, and reactive to light.  ?Cardiovascular:  ?   Rate and Rhythm: Normal rate and regular rhythm.  ?   Pulses: Normal pulses.  ?   Heart sounds: Normal heart sounds.  ?Pulmonary:  ?   Effort: Pulmonary effort is normal.  ?Abdominal:  ?   Palpations:  Abdomen is soft.  ?   Tenderness: There is no abdominal tenderness.  ?Musculoskeletal:  ?   Cervical back: Neck supple.  ?Skin: ?   Findings: No rash.  ?Neurological:  ?   Mental Status: She is alert and oriented to person, place, and time.  ?   GCS: GCS eye subscore is 4. GCS verbal subscore is 5. GCS motor subscore is 6.  ?   Cranial Nerves: Cranial nerves 2-12 are intact.  ?   Sensory: Sensation is intact.  ?   Motor: Motor function is intact.  ?   Coordination: Coordination is intact.  ?Psychiatric:     ?   Mood and Affect: Mood normal.  ? ? ?ED Results / Procedures / Treatments   ?Labs ?(all labs ordered are listed, but only abnormal results are displayed) ?Labs Reviewed  ?COMPREHENSIVE METABOLIC PANEL - Abnormal; Notable for the following components:  ?    Result Value  ? Potassium 3.1 (*)   ? Glucose, Bld 104 (*)   ? All other components within normal limits  ?TROPONIN I (HIGH SENSITIVITY) - Abnormal; Notable for the following components:  ? Troponin I (High Sensitivity) 40 (*)   ? All other components within normal limits  ?CBC  ?RAPID URINE DRUG SCREEN, HOSP PERFORMED  ?TROPONIN I (HIGH SENSITIVITY)  ? ? ?EKG ?EKG Interpretation ? ?Date/Time:  Thursday Feb 11 2022 13:10:41 EDT ?Ventricular Rate:  71 ?PR Interval:  142 ?QRS Duration: 92 ?QT Interval:  439 ?QTC Calculation: 478 ?R Axis:   93 ?Text Interpretation: Sinus rhythm Right axis deviation Abnormal R-wave progression, late transition Borderline T abnormalities, anterior leads no acute ischemic appearance, lateral s wave more pronounced compared to previous Confirmed by Charlesetta Shanks [phone]) on 02/11/2022 3:21:12 PM ? ?Radiology ?CT Head Wo Contrast ? ?Result Date: 02/11/2022 ?CLINICAL DATA:  Headache EXAM: CT HEAD WITHOUT CONTRAST TECHNIQUE: Contiguous axial images were obtained from the base of the skull through the vertex without intravenous contrast. RADIATION DOSE REDUCTION: This exam was performed according to the departmental dose-optimization  program which includes automated exposure control, adjustment of the mA and/or kV according to patient size and/or use of iterative reconstruction technique. COMPARISON:  Head CT dated November 14, 2013 FINDINGS: Brain: Chronic white matter ischemic change. Unchanged calcifications of the anterior and posterior falx. No evidence of acute infarction, hemorrhage, hydrocephalus, extra-axial collection or mass lesion/mass effect. Vascular: No hyperdense vessel or unexpected calcification. Skull: Normal. Negative for fracture or focal lesion. Sinuses/Orbits: No acute finding. Other: None. IMPRESSION: No acute intracranial abnormality. Electronically Signed   By: Yetta Glassman M.D.   On: 02/11/2022 14:17  ? ?DG Chest Port 1 View ? ?Result Date: 02/11/2022 ?CLINICAL DATA:  Hypertension EXAM: PORTABLE CHEST 1 VIEW COMPARISON:  Chest x-ray dated July 26, 2017 FINDINGS: Cardiac and mediastinal contours are within  normal limits for AP technique. Both lungs are clear. Dextrocurvature of the thoracic spine. IMPRESSION: No active disease. Electronically Signed   By: Yetta Glassman M.D.   On: 02/11/2022 14:11   ? ?Procedures ?Procedures  ? ? ?Medications Ordered in ED ?Medications  ?carvedilol (COREG) tablet 12.5 mg (has no administration in time range)  ?labetalol (NORMODYNE) injection 10 mg (has no administration in time range)  ?acetaminophen (TYLENOL) tablet 1,000 mg (1,000 mg Oral Given 02/11/22 1416)  ?amLODipine (NORVASC) tablet 10 mg (10 mg Oral Given 02/11/22 1416)  ? ? ?ED Course/ Medical Decision Making/ A&P ?  ?                        ?Medical Decision Making ? ?BP (!) 210/112   Pulse 74   Temp 98.9 ?F (37.2 ?C) (Oral)   Resp 20   Ht 5' 8"  (1.727 m)   Wt 82.6 kg   SpO2 100%   BMI 27.67 kg/m?  ? ?1:37 PM ?This is a 61 year old female with known history of hypertension currently on amlodipine and Diovan presenting with complaints of headache as well as concerns of elevated blood pressure.  She was at work  today when she developed blurry vision and throbbing headache and when she had a blood pressure check it was reading greater than 934 systolic.  Patient sent here for further assessment.  Currently her blood pressure is

## 2022-02-12 ENCOUNTER — Encounter: Payer: No Typology Code available for payment source | Admitting: Gastroenterology

## 2022-02-12 ENCOUNTER — Telehealth: Payer: Self-pay | Admitting: Gastroenterology

## 2022-02-12 DIAGNOSIS — I161 Hypertensive emergency: Secondary | ICD-10-CM | POA: Diagnosis not present

## 2022-02-12 LAB — LIPID PANEL
Cholesterol: 242 mg/dL — ABNORMAL HIGH (ref 0–200)
HDL: 39 mg/dL — ABNORMAL LOW (ref >40)
LDL Cholesterol: 170 mg/dL — ABNORMAL HIGH (ref 0–99)
Total CHOL/HDL Ratio: 6.2 RATIO
Triglycerides: 164 mg/dL — ABNORMAL HIGH (ref <150)
VLDL: 33 mg/dL (ref 0–40)

## 2022-02-12 LAB — BASIC METABOLIC PANEL
Anion gap: 5 (ref 5–15)
BUN: 14 mg/dL (ref 8–23)
CO2: 27 mmol/L (ref 22–32)
Calcium: 9.6 mg/dL (ref 8.9–10.3)
Chloride: 106 mmol/L (ref 98–111)
Creatinine, Ser: 0.96 mg/dL (ref 0.44–1.00)
GFR, Estimated: >60 mL/min (ref >60)
Glucose, Bld: 112 mg/dL — ABNORMAL HIGH (ref 70–99)
Potassium: 3.6 mmol/L (ref 3.5–5.1)
Sodium: 138 mmol/L (ref 135–145)

## 2022-02-12 LAB — TROPONIN I (HIGH SENSITIVITY): Troponin I (High Sensitivity): 44 ng/L — ABNORMAL HIGH (ref <18)

## 2022-02-12 LAB — HIV ANTIBODY (ROUTINE TESTING W REFLEX): HIV Screen 4th Generation wRfx: NONREACTIVE

## 2022-02-12 MED ORDER — AMLODIPINE BESYLATE 10 MG PO TABS
10.0000 mg | ORAL_TABLET | Freq: Every day | ORAL | Status: DC
  Administered 2022-02-12: 10 mg via ORAL
  Filled 2022-02-12: qty 1

## 2022-02-12 MED ORDER — ATORVASTATIN CALCIUM 40 MG PO TABS
40.0000 mg | ORAL_TABLET | Freq: Every day | ORAL | Status: DC
  Administered 2022-02-12: 40 mg via ORAL
  Filled 2022-02-12: qty 1

## 2022-02-12 MED ORDER — CARVEDILOL 12.5 MG PO TABS
12.5000 mg | ORAL_TABLET | Freq: Two times a day (BID) | ORAL | Status: DC
  Administered 2022-02-12: 12.5 mg via ORAL
  Filled 2022-02-12: qty 1

## 2022-02-12 NOTE — Progress Notes (Signed)
Family Medicine Teaching Service ?Daily Progress Note ?Intern Pager: [phone] ? ?Patient name: Shelby Rojas Medical record number: [id number] ?Date of birth: 1961/03/03 Age: 61 y.o. Gender: female ? ?Primary Care Provider: Pcp, No ?Consultants: None ?Code Status: Full ? ?Pt Overview and Major Events to Date:  ?5/11 - admitted ? ?Assessment and Plan: ? ?Shelby Rojas is a 61 y.o. female presenting with hypertensive emergency . PMH is significant for hypertension, HLD, prediabetes. ?  ?Hypertensive Emergency ?Patient presented with HA and hypertensive to 227/104 on admission. She received Coreg 12.5, amlodipine 10, IV labetalol 10 x 1. BP today 120's/60's, below target range, antihypertensives held. Will consider restarting Irbesartan 37 mg daily when BP more appropriate/at discharge.  ?-Continuous cardiac monitoring ?-Goal SBP < 200 and >140, DBP < 110 ?-Hold Irbesartan 37 mg, Coreg 12.5 mg BID, Amlodipine 10 mg daily ?-F/u renin/aldosterone ratio ? ?Hypokalemia ?K 3.1 on admission, 3.6 today. ?-F/u BMP ? ?Carpal tunnel syndrome ?Gabapentin 300 mg TID ? ?Hyperlipidemia ?Lipid panel T. Chol 242, Tri 164, LDL 170, patient reports she hasn't been taking it. ?Restart Lipitor 40 mg daily ? ?Prediabetes ?A1c 6.1 2/23 ? ?FEN/GI: Regular diet ?PPx: Lovenox ?Dispo:Home today.   ? ?Subjective:  ?Doing well, no complaints, ready for d/c ? ?Objective: ?Temp:  [98.1 ?F (36.7 ?C)-98.9 ?F (37.2 ?C)] 98.3 ?F (36.8 ?C) (05/12 0315) ?Pulse Rate:  [66-101] 68 (05/12 0315) ?Resp:  [16-20] 16 (05/12 0315) ?BP: (120-227)/(63-148) 121/63 (05/12 0315) ?SpO2:  [96 %-100 %] 96 % (05/12 0315) ?Weight:  [82.6 kg] 82.6 kg (05/11 1311) ?Physical Exam: ?General: Well appearing, NAD, polite, African American woman ?Cardiovascular: RRR, NRMG ?Respiratory: CTABL ?Abdomen: Soft, NTTP, non-distended ?Extremities: no edema, cap refill < 2 sec ? ?Laboratory: ?Recent Labs  ?Lab 02/11/22 ?1343  ?WBC 10.0  ?HGB 14.1  ?HCT 42.9  ?PLT 262  ? ?Recent  Labs  ?Lab 02/11/22 ?1343  ?NA 140  ?K 3.1*  ?CL 105  ?CO2 27  ?BUN 11  ?CREATININE 0.98  ?CALCIUM 9.1  ?PROT 7.0  ?BILITOT 0.6  ?ALKPHOS 61  ?ALT 19  ?AST 21  ?GLUCOSE 104*  ? ? ? ? ?Imaging/Diagnostic Tests: ? ? ?Bess Kinds, MD ?02/12/2022, 6:28 AM ?PGY-1, [facility] ?FPTS Intern pager: [phone], text pages welcome ? ?

## 2022-02-12 NOTE — Discharge Summary (Shared)
Family Medicine Teaching Service ?Hospital Discharge Summary ? ?Patient name: Shelby Rojas Medical record number: [id number] ?Date of birth: Jun 04, 1961 Age: 61 y.o. Gender: female ?Date of Admission: 02/11/2022  Date of Discharge: 02/12/22 ?Admitting Physician: Darral Dash, DO ? ?Primary Care Provider: Pcp, No ?Consultants:  ? ?Indication for Hospitalization: Hypertensive emergency  ? ?Discharge Diagnoses/Problem List:  ?HTN  ? ?Disposition: home  ? ?Discharge Condition: medically stable for discharge  ? ?Discharge Exam: *** ? ?Brief Hospital Course:  ? ? ? ?Hypertensive Emergency ?Shelby Rojas is a 61 yr old female who presented to the ED with headache, dizziness and elevated Bps to 227/104 likely in the setting of medication non-compliance. CT head showed no acute abnormality. Labs: K 3.1, trop 40, 64. UDS: neg. She received labetalol, coreg and amlodipine in the hospital which normalized pt's Bps.  ? ?HLD  ?Restarted Atorvastatin 40mg  for primary prevention. ? ? ?PCP recommendations:  ?Monitor BP closely. We restarted pt's coreg and amlodipine in the hospital. All other antihypertensive medications were held ? ? ?Significant Procedures:  ? ?Significant Labs and Imaging:  ?Recent Labs  ?Lab 02/11/22 ?1343  ?WBC 10.0  ?HGB 14.1  ?HCT 42.9  ?PLT 262  ? ?Recent Labs  ?Lab 02/11/22 ?1343 02/12/22 ?0754  ?NA 140 138  ?K 3.1* 3.6  ?CL 105 106  ?CO2 27 27  ?GLUCOSE 104* 112*  ?BUN 11 14  ?CREATININE 0.98 0.96  ?CALCIUM 9.1 9.6  ?ALKPHOS 61  --   ?AST 21  --   ?ALT 19  --   ?ALBUMIN 3.8  --   ? ? ?Results/Tests Pending at Time of Discharge:  ? ?Discharge Medications:  ?Allergies as of 02/12/2022   ? ?   Reactions  ? Codeine Anaphylaxis  ? Cyclobenzaprine Anaphylaxis, Swelling, Other (See Comments)  ? The patient stated she is allergic to this.  ? ?  ? ?  ?Medication List  ?  ? ?STOP taking these medications   ? ?aspirin 325 MG EC tablet ?  ?Clenpiq 10-3.5-12 MG-GM -GM/160ML Soln ?Generic drug: Sod Picosulfate-Mag  Ox-Cit Acd ?  ?fluticasone 50 MCG/ACT nasal spray ?Commonly known as: FLONASE ?  ?ibuprofen 200 MG tablet ?Commonly known as: ADVIL ?  ?losartan-hydrochlorothiazide 100-25 MG tablet ?Commonly known as: HYZAAR ?  ?spironolactone 25 MG tablet ?Commonly known as: ALDACTONE ?  ?valsartan-hydrochlorothiazide 320-25 MG tablet ?Commonly known as: DIOVAN-HCT ?  ? ?  ? ?TAKE these medications   ? ?amLODipine 10 MG tablet ?Commonly known as: NORVASC ?Take 1 tablet (10 mg total) by mouth daily. ?What changed: when to take this ?  ?atorvastatin 40 MG tablet ?Commonly known as: LIPITOR ?TAKE 1 TABLET (40 MG TOTAL) BY MOUTH DAILY. ?  ?carvedilol 12.5 MG tablet ?Commonly known as: COREG ?Take 1 tablet (12.5 mg total) by mouth 2 (two) times daily with a meal. ?  ?gabapentin 300 MG capsule ?Commonly known as: NEURONTIN ?Take 1 capsule (300 mg total) by mouth 3 (three) times daily. ?  ? ?  ? ? ?Discharge Instructions: Please refer to Patient Instructions section of EMR for full details.  Patient was counseled important signs and symptoms that should prompt return to medical care, changes in medications, dietary instructions, activity restrictions, and follow up appointments.  ? ?Follow-Up Appointments: ? ? ?04-02-1972, MD ?02/12/2022, 3:05 PM ?PGY-3,  Family Medicine  ?

## 2022-02-12 NOTE — Telephone Encounter (Signed)
Good Morning Dr. Barron Alvine, ? ?Called this patient and she is still in the hospital she will call to reschedule. I will cancel appointment.  ?

## 2022-02-12 NOTE — Discharge Summary (Signed)
Family Medicine Teaching Service ?Hospital Discharge Summary ? ?Patient name: Shelby Rojas Medical record number: [id number] ?Date of birth: [birth year] Age: 61 y.o. Gender: female ?Date of Admission: 02/11/2022  Date of Discharge: 02/12/22 ?Admitting Physician: Darral Dash, DO ? ?Primary Care Provider: Pcp, No ?Consultants: None ? ?Indication for Hospitalization: Hypertensive Emergency ? ?Discharge Diagnoses/Problem List:  ?Hypertensive Emergency ?Hypokalemia ?Carpal tunnel syndrome ?Hyperlipidemia ? ?Disposition: Home ? ?Discharge Condition: Improved ? ?Discharge Exam:  ? ?Temp:  [98.1 ?F (36.7 ?C)-98.9 ?F (37.2 ?C)] 98.3 ?F (36.8 ?C) (05/12 0315) ?Pulse Rate:  [66-101] 68 (05/12 0315) ?Resp:  [16-20] 16 (05/12 0315) ?BP: (120-227)/(63-148) 121/63 (05/12 0315) ?SpO2:  [96 %-100 %] 96 % (05/12 0315) ?Weight:  [82.6 kg] 82.6 kg (05/11 1311) ?Physical Exam: ?General: Well appearing, NAD, polite, African American woman ?Cardiovascular: RRR, NRMG ?Respiratory: CTABL ?Abdomen: Soft, NTTP, non-distended ?Extremities: no edema, cap refill < 2 sec ? ?Brief Hospital Course:  ?Shelby Rojas is a 61 y.o. female presenting with hypertensive emergency . PMH is significant for hypertension, HLD, prediabetes ? ?Hypertensive Emergency ?Shelby Rojas is a 61 yr old female who presented to the ED with headache, dizziness and elevated Bps to 227/104 likely in the setting of medication non-compliance. CT head showed no acute abnormality. Labs: K 3.1, trop 40>64>44. UDS: neg. She received labetalol, coreg and amlodipine in the hospital which normalized pt's Bps. Her symptoms of headache also resolved.  ? ?HLD  ?Restarted Atorvastatin 40mg  for primary prevention. ? ? ?PCP recommendations:  ?Monitor BP closely. We restarted pt's coreg and amlodipine in the hospital. If controlled consider adding other anti-hypertensive agents ?Needs to establish care with PCP  ? ? ? ?Significant Procedures: None ? ?Significant Labs and  Imaging:  ?Recent Labs  ?Lab 02/11/22 ?1343  ?WBC 10.0  ?HGB 14.1  ?HCT 42.9  ?PLT 262  ? ?Recent Labs  ?Lab 02/11/22 ?1343 02/12/22 ?0754  ?NA 140 138  ?K 3.1* 3.6  ?CL 105 106  ?CO2 27 27  ?GLUCOSE 104* 112*  ?BUN 11 14  ?CREATININE 0.98 0.96  ?CALCIUM 9.1 9.6  ?ALKPHOS 61  --   ?AST 21  --   ?ALT 19  --   ?ALBUMIN 3.8  --   ? ? ? ? ?Results/Tests Pending at Time of Discharge: None ? ?Discharge Medications:  ?Allergies as of 02/12/2022   ? ?   Reactions  ? Codeine Anaphylaxis  ? Cyclobenzaprine Anaphylaxis, Swelling, Other (See Comments)  ? The patient stated she is allergic to this.  ? ?  ? ?  ?Medication List  ?  ? ?STOP taking these medications   ? ?aspirin 325 MG EC tablet ?  ?Clenpiq 10-3.5-12 MG-GM -GM/160ML Soln ?Generic drug: Sod Picosulfate-Mag Ox-Cit Acd ?  ?fluticasone 50 MCG/ACT nasal spray ?Commonly known as: FLONASE ?  ?ibuprofen 200 MG tablet ?Commonly known as: ADVIL ?  ?losartan-hydrochlorothiazide 100-25 MG tablet ?Commonly known as: HYZAAR ?  ?spironolactone 25 MG tablet ?Commonly known as: ALDACTONE ?  ?valsartan-hydrochlorothiazide 320-25 MG tablet ?Commonly known as: DIOVAN-HCT ?  ? ?  ? ?TAKE these medications   ? ?amLODipine 10 MG tablet ?Commonly known as: NORVASC ?Take 1 tablet (10 mg total) by mouth daily. ?What changed: when to take this ?  ?atorvastatin 40 MG tablet ?Commonly known as: LIPITOR ?TAKE 1 TABLET (40 MG TOTAL) BY MOUTH DAILY. ?  ?carvedilol 12.5 MG tablet ?Commonly known as: COREG ?Take 1 tablet (12.5 mg total) by mouth 2 (two) times daily with a meal. ?  ?  gabapentin 300 MG capsule ?Commonly known as: NEURONTIN ?Take 1 capsule (300 mg total) by mouth 3 (three) times daily. ?  ? ?  ? ? ?Discharge Instructions: Please refer to Patient Instructions section of EMR for full details.  Patient was counseled important signs and symptoms that should prompt return to medical care, changes in medications, dietary instructions, activity restrictions, and follow up appointments.   ? ?Follow-Up Appointments: ? Follow-up Information   ? ? Littie Deeds, MD. Nyra Capes on 02/16/2022.   ?Specialty: Family Medicine ?Why: At 3:10pm. Please arrive by 2:55pm. This is the hospital follow up appointment with the family medicine clinic. ?Contact information: ?[address] ?[place] [address] ?[phone] ? ? ?  ?  ? ?  ?  ? ?  ? ? ?Bess Kinds, MD ?02/12/2022, 8:51 PM ?PGY-1, [facility] ? ?

## 2022-02-12 NOTE — TOC Transition Note (Signed)
Transition of Care (TOC) - CM/SW Discharge Note ? ? ?Patient Details  ?Name: Shelby Rojas ?MRN: [id number] ?Date of Birth: 06/14/1961 ? ?Transition of Care (TOC) CM/SW Contact:  ?Kermit Balo, RN ?Phone Number: ?02/12/2022, 2:30 PM ? ? ?Clinical Narrative:    ?Patient is from home with her daughter and grandchildren. She manages her own medications at home and drives self as needed.  ?No PCP. Per patient the [facility] is going to see her in their clinic. ?Pt has transportation home once discharged.  ? ? ?Final next level of care: Home/Self Care ?Barriers to Discharge: No Barriers Identified ? ? ?Patient Goals and CMS Choice ?  ?  ?  ? ?Discharge Placement ?  ?           ?  ?  ?  ?  ? ?Discharge Plan and Services ?  ?  ?           ?  ?  ?  ?  ?  ?  ?  ?  ?  ?  ? ?Social Determinants of Health (SDOH) Interventions ?  ? ? ?Readmission Risk Interventions ?   ? View : No data to display.  ?  ?  ?  ? ? ? ? ? ?

## 2022-02-12 NOTE — Discharge Instructions (Addendum)
Dear Shelby Rojas, ? ?Thank you for letting us participate in your care. You were hospitalized for hypertensive emergency. ? ?POST-HOSPITAL & CARE INSTRUCTIONS ?Continue your Coreg 12.5 mg twice daily ?Continue amlodipine 10 mg daily ?Do not take any other medications for your blood pressure until instructed to by a doctor. ?Go to your follow-up appointment at the family medicine clinic, details below.  Make sure to bring all of your medicines from home to this appointment. ?Go to your follow up appointments (listed below) ? ?DOCTOR'S APPOINTMENT   ?Future Appointments  ?Date Time Provider Department Center  ?02/16/2022  3:10 PM Littie Deeds, MD [facility]  ? ? Follow-up Information   ? ? Littie Deeds, MD. Nyra Capes on 02/16/2022.   ?Specialty: Family Medicine ?Why: At 3:10pm. Please arrive by 2:55pm. This is the hospital follow up appointment with the family medicine clinic. ?Contact information: ?[address] ?[place] [address] ?[phone] ? ? ?  ?  ? ?  ?  ? ?  ? ?Take care and be well! ? ?Family Medicine Teaching Service Inpatient Team ?[facility]  ?[facility]  ?[address] [place], [place] [address] ?[phone] ?

## 2022-02-12 NOTE — Progress Notes (Signed)
Wheeled to the lobby in stable condition.  Verbalized understanding of the discharge instructions.  ?

## 2022-02-12 NOTE — Hospital Course (Addendum)
Shelby Rojas is a 61 y.o. female presenting with hypertensive emergency . PMH is significant for hypertension, HLD, prediabetes ? ?Hypertensive Emergency ?Shelby Rojas is a 61 yr old female who presented to the ED with headache, dizziness and elevated Bps to 227/104 likely in the setting of medication non-compliance. CT head showed no acute abnormality. Labs: K 3.1, trop 40>64>44. UDS: neg. She received labetalol, coreg and amlodipine in the hospital which normalized pt's Bps. Her symptoms of headache also resolved.  ? ?HLD  ?Restarted Atorvastatin 40mg  for primary prevention. ? ? ?PCP recommendations:  ?Monitor BP closely. We restarted pt's coreg and amlodipine in the hospital. If controlled consider adding other anti-hypertensive agents ?Needs to establish care with PCP  ?

## 2022-02-13 ENCOUNTER — Other Ambulatory Visit: Payer: Self-pay | Admitting: Student

## 2022-02-13 DIAGNOSIS — R7303 Prediabetes: Secondary | ICD-10-CM

## 2022-02-13 DIAGNOSIS — E782 Mixed hyperlipidemia: Secondary | ICD-10-CM

## 2022-02-13 DIAGNOSIS — I10 Essential (primary) hypertension: Secondary | ICD-10-CM

## 2022-02-13 MED ORDER — ATORVASTATIN CALCIUM 40 MG PO TABS: ORAL_TABLET | Freq: Every day | ORAL | 0 refills | Status: DC

## 2022-02-13 MED ORDER — AMLODIPINE BESYLATE 10 MG PO TABS
10.0000 mg | ORAL_TABLET | Freq: Every day | ORAL | 0 refills | Status: DC
Start: 2022-02-13 — End: 2022-05-06

## 2022-02-13 MED ORDER — GABAPENTIN 300 MG PO CAPS: 300.0000 mg | ORAL_CAPSULE | Freq: Three times a day (TID) | ORAL | 0 refills | Status: DC

## 2022-02-13 MED ORDER — CARVEDILOL 12.5 MG PO TABS: 12.5000 mg | ORAL_TABLET | Freq: Two times a day (BID) | ORAL | 0 refills | Status: DC

## 2022-02-13 NOTE — Progress Notes (Signed)
Patient did not have medications at discharge filled at pharmacy. I placed orders for amlodipine, atorvostatin, carvdeilol and gabapentin for 30 days. ? ?Darral Dash, DO ?PGY-1 [facility] ?

## 2022-02-16 ENCOUNTER — Ambulatory Visit: Payer: No Typology Code available for payment source | Admitting: Family Medicine

## 2022-02-16 ENCOUNTER — Encounter: Payer: Self-pay | Admitting: Family Medicine

## 2022-02-16 NOTE — Progress Notes (Deleted)
    SUBJECTIVE:   CHIEF COMPLAINT / HPI:  No chief complaint on file.   Admitted 5/11-5/12 for hypertensive emergency with headache and dizziness likely from medication non-adherence. CT head unremarkable, troponins flat. Continued on amlodipine 10 mg and carvedilol 12.5 mg BID. Losartan-HCTZ discontinued. Renin/aldosterone pending.  PERTINENT  PMH / PSH: ***  Patient Care Team: Pcp, No as PCP - General   OBJECTIVE:   There were no vitals taken for this visit.  Physical Exam      12/30/2021   11:09 AM  Depression screen PHQ 2/9  Decreased Interest 0  Down, Depressed, Hopeless 0  PHQ - 2 Score 0  Altered sleeping 1  Tired, decreased energy 0  Change in appetite 1  Feeling bad or failure about yourself  1  Trouble concentrating 0  Moving slowly or fidgety/restless 0  Suicidal thoughts 0  PHQ-9 Score 3     {Show previous vital signs (optional):23777}  {Labs  Heme  Chem  Endocrine  Serology  Results Review (optional):23779}  ASSESSMENT/PLAN:   No problem-specific Assessment & Plan notes found for this encounter.    No follow-ups on file.   Zola Button, MD [facility]

## 2022-02-16 NOTE — Patient Instructions (Incomplete)
It was nice seeing you today!  Blood work today.  See me in 3 months or whenever is a good for you.  Stay well, Anatalia Kronk, MD [facility] [phone]  --  Make sure to check out at the front desk before you leave today.  Please arrive at least 15 minutes prior to your scheduled appointments.  If you had blood work today, I will send you a MyChart message or a letter if results are normal. Otherwise, I will give you a call.  If you had a referral placed, they will call you to set up an appointment. Please give us a call if you don't hear back in the next 2 weeks.  If you need additional refills before your next appointment, please call your pharmacy first.  

## 2022-02-18 LAB — ALDOSTERONE + RENIN ACTIVITY W/ RATIO
ALDO / PRA Ratio: 47.3 — ABNORMAL HIGH (ref 0.0–30.0)
Aldosterone: 11.6 ng/dL (ref 0.0–30.0)
PRA LC/MS/MS: 0.245 ng/mL/hr (ref 0.167–5.380)

## 2022-03-03 NOTE — Patient Instructions (Incomplete)
It was nice seeing you today!  Blood work today.  See me in 3 months or whenever is a good for you.  Stay well, Tytan Sandate, MD  [facility] [phone]  --  Make sure to check out at the front desk before you leave today.  Please arrive at least 15 minutes prior to your scheduled appointments.  If you had blood work today, I will send you a MyChart message or a letter if results are normal. Otherwise, I will give you a call.  If you had a referral placed, they will call you to set up an appointment. Please give us a call if you don't hear back in the next 2 weeks.  If you need additional refills before your next appointment, please call your pharmacy first.  

## 2022-03-03 NOTE — Progress Notes (Deleted)
    SUBJECTIVE:   CHIEF COMPLAINT / HPI:  No chief complaint on file.   Admitted 5/11-5/12 for hypertensive emergency with headache and dizziness likely from medication non-adherence. CT head unremarkable, troponins flat. Continued on amlodipine 10 mg and carvedilol 12.5 mg BID. Losartan-HCTZ discontinued. Renin/aldosterone pending.***  PERTINENT  PMH / PSH: HTN  Patient Care Team: Pcp, No as PCP - General   OBJECTIVE:   There were no vitals taken for this visit.  Physical Exam      12/30/2021   11:09 AM  Depression screen PHQ 2/9  Decreased Interest 0  Down, Depressed, Hopeless 0  PHQ - 2 Score 0  Altered sleeping 1  Tired, decreased energy 0  Change in appetite 1  Feeling bad or failure about yourself  1  Trouble concentrating 0  Moving slowly or fidgety/restless 0  Suicidal thoughts 0  PHQ-9 Score 3     {Show previous vital signs (optional):23777}  {Labs  Heme  Chem  Endocrine  Serology  Results Review (optional):23779}  ASSESSMENT/PLAN:   No problem-specific Assessment & Plan notes found for this encounter.    No follow-ups on file.   Littie Deeds, MD [facility]

## 2022-03-15 ENCOUNTER — Ambulatory Visit: Payer: No Typology Code available for payment source | Admitting: Family Medicine

## 2022-04-02 ENCOUNTER — Ambulatory Visit: Payer: No Typology Code available for payment source | Admitting: Family Medicine

## 2022-04-19 ENCOUNTER — Encounter: Payer: Self-pay | Admitting: Family Medicine

## 2022-05-06 ENCOUNTER — Other Ambulatory Visit: Payer: Self-pay

## 2022-05-06 DIAGNOSIS — I10 Essential (primary) hypertension: Secondary | ICD-10-CM

## 2022-05-06 MED ORDER — AMLODIPINE BESYLATE 10 MG PO TABS
10.0000 mg | ORAL_TABLET | Freq: Every day | ORAL | 0 refills | Status: DC
Start: 2022-05-06 — End: 2022-06-03

## 2022-05-06 MED ORDER — CARVEDILOL 12.5 MG PO TABS: 12.5000 mg | ORAL_TABLET | Freq: Two times a day (BID) | ORAL | 0 refills | Status: DC

## 2022-05-20 ENCOUNTER — Other Ambulatory Visit: Payer: Self-pay

## 2022-06-03 ENCOUNTER — Ambulatory Visit (INDEPENDENT_AMBULATORY_CARE_PROVIDER_SITE_OTHER): Payer: No Typology Code available for payment source | Admitting: Family Medicine

## 2022-06-03 ENCOUNTER — Encounter: Payer: Self-pay | Admitting: Family Medicine

## 2022-06-03 VITALS — BP 171/88 | HR 67 | Ht 68.0 in | Wt 184.6 lb

## 2022-06-03 DIAGNOSIS — Z20822 Contact with and (suspected) exposure to covid-19: Secondary | ICD-10-CM

## 2022-06-03 DIAGNOSIS — I10 Essential (primary) hypertension: Secondary | ICD-10-CM

## 2022-06-03 DIAGNOSIS — J029 Acute pharyngitis, unspecified: Secondary | ICD-10-CM | POA: Diagnosis not present

## 2022-06-03 DIAGNOSIS — Z09 Encounter for follow-up examination after completed treatment for conditions other than malignant neoplasm: Secondary | ICD-10-CM | POA: Diagnosis not present

## 2022-06-03 MED ORDER — AMLODIPINE BESYLATE 10 MG PO TABS: 10.0000 mg | ORAL_TABLET | Freq: Every day | ORAL | 2 refills | Status: DC

## 2022-06-03 MED ORDER — CARVEDILOL 12.5 MG PO TABS: 12.5000 mg | ORAL_TABLET | Freq: Two times a day (BID) | ORAL | 2 refills | Status: DC

## 2022-06-03 MED ORDER — CARVEDILOL 6.25 MG PO TABS: 6.2500 mg | ORAL_TABLET | Freq: Two times a day (BID) | ORAL | 2 refills | Status: DC

## 2022-06-03 NOTE — Patient Instructions (Addendum)
It was nice seeing you today!  Take carvedilol to 12.5 mg twice a day. Continue amlodipine.  Continue checking your blood pressure at home and write them down.  Schedule a visit for physical/establish care in the next 3 months.  Stay well, Littie Deeds, MD [facility] [phone]  --  Make sure to check out at the front desk before you leave today.  Please arrive at least 15 minutes prior to your scheduled appointments.  If you had blood work today, I will send you a MyChart message or a letter if results are normal. Otherwise, I will give you a call.  If you had a referral placed, they will call you to set up an appointment. Please give [place] a call if you don't hear back in the next 2 weeks.  If you need additional refills before your next appointment, please call your pharmacy first.

## 2022-06-03 NOTE — Progress Notes (Signed)
    SUBJECTIVE:   CHIEF COMPLAINT / HPI:  Chief Complaint  Patient presents with   Hosp f/u    Presents today for my hospital follow-up which was back in May due to hypertensive emergency.  Medications were adjusted, work-up was reassuring including CT scan of the head.  She missed her originally scheduled hospital follow-up appointment and was unable to make it until today.  She has had some chest wall pain which she attributes to lifting boxes at work, otherwise denies shortness of breath.  Patient plans to establish care with [place].  HTN Takes amlodipine 10 mg and carvedilol 12.5 mg BID (only takes once a day).  She has felt some heart fluttering or uncomfortable feeling when taking the carvedilol twice a day Home readings: Unsure of the exact number but states that readings have been normal and much lower than they are today  Patient is requesting a COVID test today.  She states she developed a sore throat 1 week ago.  She has had exposure to COVID at her workplace.  No other symptoms and denies fever and chills.  She wants [place] to give her a call when results are available.  PERTINENT  PMH / PSH: HTN  Patient Care Team: Littie Deeds, MD as PCP - General (Family Medicine)   OBJECTIVE:   BP (!) 157/84   Pulse 67   Ht 5\' 8"  (1.727 m)   Wt 184 lb 9.6 oz (83.7 kg)   SpO2 99%   BMI 28.07 kg/m   Physical Exam Constitutional:      General: She is not in acute distress.    Appearance: Normal appearance.  HENT:     Head: Normocephalic and atraumatic.  Cardiovascular:     Rate and Rhythm: Normal rate and regular rhythm.  Pulmonary:     Effort: Pulmonary effort is normal. No respiratory distress.     Breath sounds: Normal breath sounds.  Musculoskeletal:     Cervical back: Neck supple.  Neurological:     Mental Status: She is alert.         06/03/2022    3:51 PM  Depression screen PHQ 2/9  Decreased Interest 2  Down, Depressed, Hopeless 0  PHQ - 2 Score 2  Altered  sleeping 1  Tired, decreased energy 3  Change in appetite 1  Feeling bad or failure about yourself  0  Trouble concentrating 0  Moving slowly or fidgety/restless 3  Suicidal thoughts 0  PHQ-9 Score 10  Difficult doing work/chores Not difficult at all     {Show previous vital signs (optional):23777}    ASSESSMENT/PLAN:   Essential hypertension Elevated in office but reports normal home readings.  Continue amlodipine 10 mg and patient will go back to taking the prescribed carvedilol 12.5 mg twice daily.  Continue to monitor, advised to check readings at home and bring log to office at next visit.   Sore throat Known COVID exposure, reasonable to test as she is still symptomatic - Covid test - work noted provided  Return in about 3 months (around 09/02/2022) for physical, establish care.   09/04/2022, MD [facility]

## 2022-06-04 NOTE — Assessment & Plan Note (Addendum)
Elevated in office but reports normal home readings.  Continue amlodipine 10 mg and patient will go back to taking the prescribed carvedilol 12.5 mg twice daily.  Continue to monitor, advised to check readings at home and bring log to office at next visit.  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

## 2022-06-05 LAB — NOVEL CORONAVIRUS, NAA: SARS-CoV-2, NAA: NOT DETECTED

## 2022-06-09 NOTE — Progress Notes (Signed)
Informed pt about her covid test results. Pt understood

## 2022-07-28 ENCOUNTER — Telehealth: Payer: Self-pay | Admitting: Family Medicine

## 2022-07-28 ENCOUNTER — Other Ambulatory Visit: Payer: Self-pay | Admitting: Physician Assistant

## 2022-07-28 DIAGNOSIS — I10 Essential (primary) hypertension: Secondary | ICD-10-CM

## 2022-07-28 NOTE — Telephone Encounter (Signed)
Medication Refill - Medication: amLODipine (NORVASC) 10 MG tablet    Has the patient contacted their pharmacy? yes (Agent: If no, request that the patient contact the pharmacy for the refill. If patient does not wish to contact the pharmacy document the reason why and proceed with request.) (Agent: If yes, when and what did the pharmacy advise?)  Preferred Pharmacy (with phone number or street name):  [id number] - [employer], [address] Phone:  [phone]  Fax:  [phone]     Has the patient been seen for an appointment in the last year OR does the patient have an upcoming appointment? yes  Agent: Please be advised that RX refills may take up to 3 business days. We ask that you follow-up with your pharmacy.

## 2022-07-29 ENCOUNTER — Ambulatory Visit: Payer: No Typology Code available for payment source | Admitting: Physician Assistant

## 2022-07-29 MED ORDER — AMLODIPINE BESYLATE 10 MG PO TABS: 10.0000 mg | ORAL_TABLET | Freq: Every day | ORAL | 0 refills | Status: DC

## 2022-07-29 NOTE — Telephone Encounter (Signed)
Requested medication (s) are due for refill today: unsure  Requested medication (s) are on the active medication list: yes  Last refill:  06/03/22  Future visit scheduled:yes  Notes to clinic:  Unable to refill per protocol, cannot delegate.Medication not assigned to protocol, routing for review.      Requested Prescriptions  Pending Prescriptions Disp Refills   amLODipine (NORVASC) 10 MG tablet 30 tablet 2    Sig: Take 1 tablet (10 mg total) by mouth daily.     There is no refill protocol information for this order

## 2022-09-01 ENCOUNTER — Other Ambulatory Visit (HOSPITAL_COMMUNITY)
Admission: RE | Admit: 2022-09-01 | Discharge: 2022-09-01 | Disposition: A | Discharge status: Home / Self Care | Payer: No Typology Code available for payment source | Source: Ambulatory Visit | Attending: Physician Assistant | Admitting: Physician Assistant

## 2022-09-01 ENCOUNTER — Encounter: Payer: Self-pay | Admitting: Physician Assistant

## 2022-09-01 ENCOUNTER — Ambulatory Visit: Payer: No Typology Code available for payment source | Attending: Physician Assistant | Admitting: Physician Assistant

## 2022-09-01 VITALS — BP 195/105 | HR 77 | Temp 98.1°F | Ht 68.0 in | Wt 186.0 lb

## 2022-09-01 DIAGNOSIS — N898 Other specified noninflammatory disorders of vagina: Secondary | ICD-10-CM

## 2022-09-01 DIAGNOSIS — R7303 Prediabetes: Secondary | ICD-10-CM | POA: Diagnosis not present

## 2022-09-01 DIAGNOSIS — E782 Mixed hyperlipidemia: Secondary | ICD-10-CM

## 2022-09-01 DIAGNOSIS — M79604 Pain in right leg: Secondary | ICD-10-CM

## 2022-09-01 DIAGNOSIS — I10 Essential (primary) hypertension: Secondary | ICD-10-CM | POA: Diagnosis not present

## 2022-09-01 DIAGNOSIS — R350 Frequency of micturition: Secondary | ICD-10-CM | POA: Diagnosis present

## 2022-09-01 DIAGNOSIS — M545 Low back pain, unspecified: Secondary | ICD-10-CM

## 2022-09-01 DIAGNOSIS — Z91199 Patient's noncompliance with other medical treatment and regimen due to unspecified reason: Secondary | ICD-10-CM

## 2022-09-01 LAB — POCT URINALYSIS DIP (CLINITEK)
Bilirubin, UA: NEGATIVE
Glucose, UA: NEGATIVE mg/dL
Ketones, POC UA: NEGATIVE mg/dL
Nitrite, UA: NEGATIVE
POC PROTEIN,UA: NEGATIVE
Spec Grav, UA: 1.025 (ref 1.010–1.025)
Urobilinogen, UA: 1 E.U./dL
pH, UA: 7 (ref 5.0–8.0)

## 2022-09-01 MED ORDER — CARVEDILOL 12.5 MG PO TABS: 12.5000 mg | ORAL_TABLET | Freq: Two times a day (BID) | ORAL | 3 refills | Status: DC

## 2022-09-01 MED ORDER — GABAPENTIN 300 MG PO CAPS: 300.0000 mg | ORAL_CAPSULE | Freq: Three times a day (TID) | ORAL | 2 refills | Status: DC

## 2022-09-01 MED ORDER — NAPROXEN 500 MG PO TABS: 500.0000 mg | ORAL_TABLET | Freq: Two times a day (BID) | ORAL | 1 refills | Status: DC

## 2022-09-01 MED ORDER — AMLODIPINE BESYLATE 10 MG PO TABS: 10.0000 mg | ORAL_TABLET | Freq: Every day | ORAL | 0 refills | Status: DC

## 2022-09-01 MED ORDER — CLONIDINE HCL 0.1 MG PO TABS: 0.1000 mg | ORAL_TABLET | Freq: Once | ORAL | Status: DC

## 2022-09-01 MED ORDER — ATORVASTATIN CALCIUM 40 MG PO TABS: ORAL_TABLET | Freq: Every day | ORAL | 0 refills | Status: DC

## 2022-09-01 NOTE — Patient Instructions (Addendum)
Check blood pressure daily and record.   Drink 80-100 ounces water daily 

## 2022-09-01 NOTE — Progress Notes (Signed)
Patient ID: Shelby Rojas, female   DOB: 05-24-1961, 61 y.o.   MRN: [id number]   Shelby Rojas, is a 61 y.o. female  ZHG:[id number]  HDQ:[id number]  DOB - [birth year]  Chief Complaint  Patient presents with   Hypertension    Htn f/u.  Discuss rest from work due to extreme fatigue. Urine accident.  Back pain on lower R side X2 weeks.        Subjective:   Shelby Rojas is a 61 y.o. female here today for for med RF.  Says she is taking amlodipine and took it last night but admits to "forgetting sometimes."  Out of carvedilol.  Out of cholesterol med.  Out of gabapentin.    She is having moderate to severe R lower back pain for about 2 weeks.  She does a lot of lifting at work and thinks it is bc of that.  No weakness.  No paresthesias.  Pain comes and goes.  Describes as sharp to aching.  She cannot take muscle relaxers.  Feels like she needs a couple days off.  Moving bowels and bladder normally  She has been having vaginal odor and urinary frequency.  No fever.  No burning.  Not sexually active.    No problems updated.  ALLERGIES: Allergies  Allergen Reactions   Codeine Anaphylaxis   Cyclobenzaprine Anaphylaxis, Swelling and Other (See Comments)    The patient stated she is allergic to this.    PAST MEDICAL HISTORY: Past Medical History:  Diagnosis Date   Anxiety    Arthritis    Chest pain    Cocaine use disorder (HCC)    Diastolic dysfunction without heart failure    GERD (gastroesophageal reflux disease)    High cholesterol    Hypertension    Hypertensive emergency 02/11/2022   Obesity    Panic attacks    PNA (pneumonia)    Prediabetes    Vaginal Pap smear, abnormal     MEDICATIONS AT HOME: Prior to Admission medications   Medication Sig Start Date End Date Taking? Authorizing Provider  naproxen (NAPROSYN) 500 MG tablet Take 1 tablet (500 mg total) by mouth 2 (two) times daily with a meal. Prn pain 09/01/22  Yes Peytin Dechert M, PA-C  amLODipine  (NORVASC) 10 MG tablet Take 1 tablet (10 mg total) by mouth daily. 09/01/22 11/30/22  Anders Simmonds, PA-C  atorvastatin (LIPITOR) 40 MG tablet TAKE 1 TABLET (40 MG TOTAL) BY MOUTH DAILY. 09/01/22 09/01/23  Anders Simmonds, PA-C  carvedilol (COREG) 12.5 MG tablet Take 1 tablet (12.5 mg total) by mouth 2 (two) times daily with a meal. 09/01/22   Karder Goodin, Marzella Schlein, PA-C  gabapentin (NEURONTIN) 300 MG capsule Take 1 capsule (300 mg total) by mouth 3 (three) times daily. 09/01/22 09/01/23  Anders Simmonds, PA-C    ROS: Neg HEENT Neg resp Neg cardiac Neg GI Neg psych Neg neuro  Objective:   Vitals:   09/01/22 0947 09/01/22 1033  BP: (!) 206/112 (!) 195/105  Pulse: 77   Temp: 98.1 F (36.7 C)   TempSrc: Oral   SpO2: 92%   Weight: 186 lb (84.4 kg)   Height: 5\' 8"  (1.727 m)    Exam General appearance : Awake, alert, not in any distress. Speech Clear. Not toxic looking HEENT: Atraumatic and Normocephalic, no papilledema Chest: Good air entry bilaterally, CTAB.  No rales/rhonchi/wheezing CVS: S1 S2 regular, no murmurs.  Back:ROM about 80% of normal.  +spasm in R paraspinus in the lumbar  region.  DTR =intact 2+ B Extremities: B/L Lower Ext shows no edema, both legs are warm to touch Neurology: Awake alert, and oriented X 3, CN II-XII intact, Non focal Skin: No Rash  Data Review Lab Results  Component Value Date   HGBA1C 6.1 (H) 11/04/2021   HGBA1C 6.4 (H) 07/07/2020   HGBA1C 6.4 (H) 10/03/2019    Assessment & Plan   1. Prediabetes - Hemoglobin A1c - Comprehensive metabolic panel - gabapentin (NEURONTIN) 300 MG capsule; Take 1 capsule (300 mg total) by mouth 3 (three) times daily.  Dispense: 90 capsule; Refill: 2  2. Essential hypertension Not at goal/onlay partial compliance.  Take med sand check BP daily and record(she has not been checking it).  DASH diet - Comprehensive metabolic panel - CBC with Differential/Platelet - cloNIDine (CATAPRES) tablet 0.1 mg -  carvedilol (COREG) 12.5 MG tablet; Take 1 tablet (12.5 mg total) by mouth 2 (two) times daily with a meal.  Dispense: 60 tablet; Refill: 3 - amLODipine (NORVASC) 10 MG tablet; Take 1 tablet (10 mg total) by mouth daily.  Dispense: 90 tablet; Refill: 0  3. Mixed hyperlipidemia - Lipid panel - CBC with Differential/Platelet - atorvastatin (LIPITOR) 40 MG tablet; TAKE 1 TABLET (40 MG TOTAL) BY MOUTH DAILY.  Dispense: 90 tablet; Refill: 0  4. Urinary frequency - POCT URINALYSIS DIP (CLINITEK) - Cervicovaginal ancillary only - Urine Culture  5. Vaginal odor - POCT URINALYSIS DIP (CLINITEK) - Cervicovaginal ancillary only - Urine Culture  6. Low back pain radiating to right lower extremity No red flags.  OOW today thru Fri-ok to RTW saturday - gabapentin (NEURONTIN) 300 MG capsule; Take 1 capsule (300 mg total) by mouth 3 (three) times daily.  Dispense: 90 capsule; Refill: 2 - naproxen (NAPROSYN) 500 MG tablet; Take 1 tablet (500 mg total) by mouth 2 (two) times daily with a meal. Prn pain  Dispense: 60 tablet; Refill: 1  7. Noncompliance Compliance imperative.  Patient verbalizes understanding    Return for [facility] in 3-4 weeks for BP and 3 months with PCP.  The patient was given clear instructions to go to ER or return to medical center if symptoms don't improve, worsen or new problems develop. The patient verbalized understanding. The patient was told to call to get lab results if they haven't heard anything in the next week.      Shelby Co, PA-C [facility] [place], [place] [phone]   09/01/2022, 10:35 AM

## 2022-09-02 ENCOUNTER — Other Ambulatory Visit: Payer: Self-pay | Admitting: Physician Assistant

## 2022-09-02 LAB — LIPID PANEL
Chol/HDL Ratio: 6 ratio — ABNORMAL HIGH (ref 0.0–4.4)
Cholesterol, Total: 265 mg/dL — ABNORMAL HIGH (ref 100–199)
HDL: 44 mg/dL (ref >39)
LDL Chol Calc (NIH): 202 mg/dL — ABNORMAL HIGH (ref 0–99)
Triglycerides: 106 mg/dL (ref 0–149)
VLDL Cholesterol Cal: 19 mg/dL (ref 5–40)

## 2022-09-02 LAB — CERVICOVAGINAL ANCILLARY ONLY
Bacterial Vaginitis (gardnerella): POSITIVE — AB
Candida Glabrata: NEGATIVE
Candida Vaginitis: NEGATIVE
Chlamydia: NEGATIVE
Comment: NEGATIVE
Comment: NEGATIVE
Comment: NEGATIVE
Comment: NEGATIVE
Comment: NEGATIVE
Comment: NORMAL
Neisseria Gonorrhea: NEGATIVE
Trichomonas: NEGATIVE

## 2022-09-02 LAB — CBC WITH DIFFERENTIAL/PLATELET
Basophils Absolute: 0 10*3/uL (ref 0.0–0.2)
Basos: 1 %
EOS (ABSOLUTE): 0.1 10*3/uL (ref 0.0–0.4)
Eos: 2 %
Hematocrit: 42 % (ref 34.0–46.6)
Hemoglobin: 14.1 g/dL (ref 11.1–15.9)
Immature Grans (Abs): 0 10*3/uL (ref 0.0–0.1)
Immature Granulocytes: 0 %
Lymphocytes Absolute: 1.8 10*3/uL (ref 0.7–3.1)
Lymphs: 26 %
MCH: 28.9 pg (ref 26.6–33.0)
MCHC: 33.6 g/dL (ref 31.5–35.7)
MCV: 86 fL (ref 79–97)
Monocytes Absolute: 0.5 10*3/uL (ref 0.1–0.9)
Monocytes: 8 %
Neutrophils Absolute: 4.4 10*3/uL (ref 1.4–7.0)
Neutrophils: 63 %
Platelets: 310 10*3/uL (ref 150–450)
RBC: 4.88 x10E6/uL (ref 3.77–5.28)
RDW: 11.7 % (ref 11.7–15.4)
WBC: 6.9 10*3/uL (ref 3.4–10.8)

## 2022-09-02 LAB — COMPREHENSIVE METABOLIC PANEL
ALT: 18 IU/L (ref 0–32)
AST: 19 IU/L (ref 0–40)
Albumin/Globulin Ratio: 1.8 (ref 1.2–2.2)
Albumin: 4.7 g/dL (ref 3.9–4.9)
Alkaline Phosphatase: 76 IU/L (ref 44–121)
BUN/Creatinine Ratio: 15 (ref 12–28)
BUN: 14 mg/dL (ref 8–27)
Bilirubin Total: 0.6 mg/dL (ref 0.0–1.2)
CO2: 26 mmol/L (ref 20–29)
Calcium: 10 mg/dL (ref 8.7–10.3)
Chloride: 102 mmol/L (ref 96–106)
Creatinine, Ser: 0.94 mg/dL (ref 0.57–1.00)
Globulin, Total: 2.6 g/dL (ref 1.5–4.5)
Glucose: 105 mg/dL — ABNORMAL HIGH (ref 70–99)
Potassium: 4.1 mmol/L (ref 3.5–5.2)
Sodium: 142 mmol/L (ref 134–144)
Total Protein: 7.3 g/dL (ref 6.0–8.5)
eGFR: 69 mL/min/{1.73_m2} (ref >59)

## 2022-09-02 LAB — HEMOGLOBIN A1C
Est. average glucose Bld gHb Est-mCnc: 131 mg/dL
Hgb A1c MFr Bld: 6.2 % — ABNORMAL HIGH (ref 4.8–5.6)

## 2022-09-02 MED ORDER — METRONIDAZOLE 500 MG PO TABS: 500.0000 mg | ORAL_TABLET | Freq: Two times a day (BID) | ORAL | 0 refills | Status: DC

## 2022-09-03 LAB — URINE CULTURE

## 2022-10-06 ENCOUNTER — Ambulatory Visit: Payer: No Typology Code available for payment source | Admitting: Physician Assistant

## 2022-10-07 ENCOUNTER — Ambulatory Visit: Payer: No Typology Code available for payment source | Admitting: Pharmacist

## 2022-11-03 NOTE — Progress Notes (Deleted)
   S:     PCP: Shelby Rojas  62 y.o. female who presents for hypertension evaluation, education, and management.  PMH is significant for HTN, HLD, preDM, anxiety, cocaine use disorder. Patient was referred and last seen by Primary Care Provider, Zada Finders, on 09/01/2022.  At last visit, BP was elevated at 206/112, followed by 195/105 after clonidine administration. She endorsed forgetting to take her BP medications periodically.  Today, patient arrives in *** spirits and presents without *** assistance. *** Denies dizziness, headache, blurred vision, swelling.   Patient reports hypertension was diagnosed in ***.   Family/Social history:  -Fhx: HTN -Tobacco: *** -Alcohol: *** -Illicit substances: ***  Medication adherence *** . Patient has *** taken BP medications today.   Current antihypertensives include: amlodipine 10mg  once daily, carvedilol 12.5mg  BID  Antihypertensives tried in the past include: spironolactone and valsartan/hctz (all held when hypotensive during 02/2022 admission)  Reported home BP readings: ***  Patient reported dietary habits: Eats *** meals/day Breakfast: *** Lunch: *** Dinner: *** Snacks: *** Drinks: ***  Patient-reported exercise habits: ***  ASCVD risk factors include: ***  O:  ROS  Physical Exam  Last 3 Office BP readings: BP Readings from Last 3 Encounters:  09/01/22 (!) 195/105  06/03/22 (!) 171/88  02/12/22 129/82    BMET    Component Value Date/Time   NA 142 09/01/2022 1039   K 4.1 09/01/2022 1039   CL 102 09/01/2022 1039   CO2 26 09/01/2022 1039   GLUCOSE 105 (H) 09/01/2022 1039   GLUCOSE 112 (H) 02/12/2022 0754   BUN 14 09/01/2022 1039   CREATININE 0.94 09/01/2022 1039   CALCIUM 10.0 09/01/2022 1039   GFRNONAA >60 02/12/2022 0754   GFRAA 73 07/07/2020 0915    Renal function: CrCl cannot be calculated (Patient's most recent lab result is older than the maximum 21 days allowed.).  Clinical ASCVD:  {YES/NO:21197} The 10-year ASCVD risk score (Arnett DK, et al., 2019) is: 51.8%   Values used to calculate the score:     Age: 70 years     Sex: Female     Is Non-Hispanic African American: Yes     Diabetic: No     Tobacco smoker: Yes     Systolic Blood Pressure: 889 mmHg     Is BP treated: Yes     HDL Cholesterol: 44 mg/dL     Total Cholesterol: 265 mg/dL    A/P: Hypertension diagnosed *** currently *** on current medications. BP goal < 130/80 *** mmHg. Medication adherence appears ***. Control is suboptimal due to ***.  -{Meds adjust:18428} ***.  -Patient educated on purpose, proper use, and potential adverse effects of ***.  -F/u labs ordered - *** -Counseled on lifestyle modifications for blood pressure control including reduced dietary sodium, increased exercise, adequate sleep. -Encouraged patient to check BP at home and bring log of readings to next visit. Counseled on proper use of home BP cuff.    Results reviewed and written information provided.    Written patient instructions provided. Patient verbalized understanding of treatment plan.  Total time in face to face counseling *** minutes.    Follow-up:  Pharmacist ***. PCP clinic visit in 12/03/2022  Maryan Puls, PharmD PGY-1 [facility] Pharmacy Resident

## 2022-11-04 ENCOUNTER — Ambulatory Visit: Payer: No Typology Code available for payment source | Attending: Nurse Practitioner | Admitting: Pharmacist

## 2022-11-04 DIAGNOSIS — I10 Essential (primary) hypertension: Secondary | ICD-10-CM | POA: Diagnosis not present

## 2022-11-04 MED ORDER — AMLODIPINE BESYLATE 10 MG PO TABS: 10.0000 mg | ORAL_TABLET | Freq: Every day | ORAL | 0 refills | Status: DC

## 2022-11-04 MED ORDER — VALSARTAN-HYDROCHLOROTHIAZIDE 160-12.5 MG PO TABS: 1.0000 | ORAL_TABLET | Freq: Every day | ORAL | 0 refills | Status: DC

## 2022-11-04 NOTE — Progress Notes (Signed)
S:     No chief complaint on file.  62 y.o. female who presents for hypertension evaluation, education, and management.  PMH is significant for HTN, HLD, prediabetes, anxiety, cocaine use disorder.  Patient was referred and last seen by Primary Care Provider, Dr. [name], on 09/01/2022.   At last visit, BP was elevated at 206/112, followed by 195/105 after clonidine administration. She endorsed forgetting to take her BP medications periodically.  Today, patient arrives in good spirits and presents without assistance. Denies dizziness, headache, blurred vision, swelling.   Family history: HTN Social history: Former cocaine use.  Medication adherence is sub-optimal. States she sometimes forgets her amlodipine and did not start taking the carvedilol after seeing Dr. Raul Del on 09/01/22. Patient has not taken BP medications today, states she typically takes at night.   Current antihypertensives include: amlodipine 10 mg daily, carvedilol 12.5 mg BID (not taking).  Antihypertensives tried in the past include: spironolactone and valsartan (all held when BP was low during 05/23 admission)  Reported home BP readings: States she has not been able to take measurements at home.  Patient reported dietary habits: Patient endorses trying to limit salt intake, however states she does sometimes eat some fried foods and adds salt to some meals.  Patient-reported exercise habits: Endorses walking, but has been restricted due to recent injury.  O:  ROS Physical Exam There were no vitals filed for this visit.  Last 3 Office BP readings: BP Readings from Last 3 Encounters:  09/01/22 (!) 195/105  06/03/22 (!) 171/88  02/12/22 129/82    BMET    Component Value Date/Time   NA 142 09/01/2022 1039   K 4.1 09/01/2022 1039   CL 102 09/01/2022 1039   CO2 26 09/01/2022 1039   GLUCOSE 105 (H) 09/01/2022 1039   GLUCOSE 112 (H) 02/12/2022 0754   BUN 14 09/01/2022 1039   CREATININE 0.94  09/01/2022 1039   CALCIUM 10.0 09/01/2022 1039   GFRNONAA >60 02/12/2022 0754   GFRAA 73 07/07/2020 0915    Renal function: CrCl cannot be calculated (Patient's most recent lab result is older than the maximum 21 days allowed.).  Clinical ASCVD: No  The 10-year ASCVD risk score (Arnett DK, et al., 2019) is: 51.8%   Values used to calculate the score:     Age: 21 years     Sex: Female     Is Non-Hispanic African American: Yes     Diabetic: No     Tobacco smoker: Yes     Systolic Blood Pressure: 643 mmHg     Is BP treated: Yes     HDL Cholesterol: 44 mg/dL     Total Cholesterol: 265 mg/dL  Patient is participating in a Managed Medicaid Plan:  Yes    A/P: Hypertension diagnosed in 2014 currently uncontrolled on current medications. BP goal < 130/80 mmHg. Medication adherence appears sub-optimal. -Discontinued Carvedilol 12.5 BID. Patient states she did not start taking after last appointment. -Started valsartan-HCTZ 160-12.5 daily. -F/u labs at next appointment on 11/30/22. -Counseled on lifestyle modifications for blood pressure control including reduced dietary sodium, increased exercise, adequate sleep. -Encouraged patient to check BP at home and bring log of readings to next visit. Counseled on proper use of home BP cuff.    Results reviewed and written information provided.    Written patient instructions provided. Patient verbalized understanding of treatment plan.  Total time in face to face counseling 30 minutes.    Follow-up:  Pharmacist: 11/30/22. PCP clinic  visit: 12/03/22.   Patient seen with:  Lillard Anes PharmD candidate class of 2026 [employer] [facility]  Benard Halsted, PharmD, [place], [place] [phone]

## 2022-11-12 DIAGNOSIS — Z1231 Encounter for screening mammogram for malignant neoplasm of breast: Secondary | ICD-10-CM | POA: Diagnosis not present

## 2022-11-12 LAB — HM MAMMOGRAPHY

## 2022-11-18 ENCOUNTER — Telehealth: Payer: Medicaid Other | Admitting: Emergency Medicine

## 2022-11-18 NOTE — Telephone Encounter (Signed)
Copied from [place]. Topic: General - Inquiry >> Nov 18, 2022 10:26 AM Penni Bombard wrote: Reason for CRM: pt called saying she has 5 prescriptions over at [facility] and she says they total over 300.00  she says she can not afford to pick them up. She thinks she has insurance but don't think it will cover .  Please advise for assistance.  CB#  [id number]

## 2022-11-23 NOTE — Telephone Encounter (Signed)
Unable to reach patient by phone to relay results.  Voicemail not active.

## 2022-11-26 ENCOUNTER — Other Ambulatory Visit: Payer: Self-pay

## 2022-11-30 ENCOUNTER — Ambulatory Visit: Payer: Medicaid Other | Admitting: Pharmacist

## 2022-11-30 NOTE — Telephone Encounter (Signed)
Unable to reach patient by phone to relay results.  Voicemail not active.

## 2022-12-01 NOTE — Progress Notes (Signed)
S:     PCP: [name]   62 y.o. female who presents for hypertension evaluation, education, and management.  PMH is significant for HTN, HLD, preDM, anxiety, cocaine use disorder. Patient was referred and last seen by Primary Care Provider, Zada Finders, on 09/01/2022.   At last visit with PCP, BP was elevated at 206/112, followed by 195/105 after clonidine administration. She endorsed forgetting to take her BP medications periodically.  At last visit with clinical pharmacy team, BP was 168/95 mmHg. She reported that she had not started taking the carvedilol. Carvedilol was discontinued and valsartan/hctz was initiated.   Today, patient arrives in *** spirits and presents without *** assistance. *** Denies dizziness, headache, blurred vision, swelling.   Patient reports hypertension was diagnosed in ***.   Family/Social history:  -Fhx: HTN -Tobacco: *** -Alcohol: *** -Illicit substances: former cocaine use  Medication adherence *** . Patient has *** taken BP medications today.   Current antihypertensives include: amlodipine '10mg'$  once daily, carvedilol 12.'5mg'$  BID   Antihypertensives tried in the past include: spironolactone and valsartan/hctz (all held when BP was low during 02/2022 admission)  Reported home BP readings: ***  Patient reported dietary habits: Patient endorses trying to limit salt intake, however states she does sometimes eat some fried foods and adds salt to some meals.   Patient-reported exercise habits: Endorses walking, but has been restricted due to recent injury.  O:  ROS  Physical Exam  Last 3 Office BP readings: BP Readings from Last 3 Encounters:  11/05/22 (!) 168/95  09/01/22 (!) 195/105  06/03/22 (!) 171/88    BMET    Component Value Date/Time   NA 142 09/01/2022 1039   K 4.1 09/01/2022 1039   CL 102 09/01/2022 1039   CO2 26 09/01/2022 1039   GLUCOSE 105 (H) 09/01/2022 1039   GLUCOSE 112 (H) 02/12/2022 0754   BUN 14 09/01/2022  1039   CREATININE 0.94 09/01/2022 1039   CALCIUM 10.0 09/01/2022 1039   GFRNONAA >60 02/12/2022 0754   GFRAA 73 07/07/2020 0915    Renal function: CrCl cannot be calculated (Patient's most recent lab result is older than the maximum 21 days allowed.).  Clinical ASCVD: No  The 10-year ASCVD risk score (Arnett DK, et al., 2019) is: 38.5%   Values used to calculate the score:     Age: 89 years     Sex: Female     Is Non-Hispanic African American: Yes     Diabetic: No     Tobacco smoker: Yes     Systolic Blood Pressure: [id number] mmHg     Is BP treated: Yes     HDL Cholesterol: 44 mg/dL     Total Cholesterol: 265 mg/dL    A/P: Hypertension diagnosed *** currently *** on current medications. BP goal < 130/80 *** mmHg. Medication adherence appears ***. Control is suboptimal due to ***.  -{Meds adjust:18428} ***.  -{Meds adjust:18428} ***.  -Patient educated on purpose, proper use, and potential adverse effects of ***.  -F/u labs ordered - *** -Counseled on lifestyle modifications for blood pressure control including reduced dietary sodium, increased exercise, adequate sleep. -Encouraged patient to check BP at home and bring log of readings to next visit. Counseled on proper use of home BP cuff.   Results reviewed and written information provided.    Written patient instructions provided. Patient verbalized understanding of treatment plan.  Total time in face to face counseling *** minutes.    Follow-up:  Pharmacist ***. PCP clinic  visit in 12/03/2022   Maryan Puls, PharmD PGY-1 [facility] Pharmacy Resident

## 2022-12-02 ENCOUNTER — Encounter: Payer: Self-pay | Admitting: Pharmacist

## 2022-12-02 ENCOUNTER — Other Ambulatory Visit: Payer: Self-pay

## 2022-12-02 ENCOUNTER — Ambulatory Visit: Payer: 59 | Attending: Family Medicine | Admitting: Pharmacist

## 2022-12-02 DIAGNOSIS — I10 Essential (primary) hypertension: Secondary | ICD-10-CM

## 2022-12-02 MED ORDER — AMLODIPINE BESYLATE 10 MG PO TABS
10.0000 mg | ORAL_TABLET | Freq: Every day | ORAL | 0 refills | Status: DC
  Filled 2022-12-02: qty 90, 90d supply, fill #0

## 2022-12-03 ENCOUNTER — Encounter: Payer: Self-pay | Admitting: Nurse Practitioner

## 2022-12-03 ENCOUNTER — Other Ambulatory Visit: Payer: Self-pay

## 2022-12-03 ENCOUNTER — Ambulatory Visit (HOSPITAL_BASED_OUTPATIENT_CLINIC_OR_DEPARTMENT_OTHER): Payer: 59 | Admitting: Nurse Practitioner

## 2022-12-03 VITALS — BP 178/93 | HR 77 | Ht 68.0 in | Wt 186.4 lb

## 2022-12-03 DIAGNOSIS — Z23 Encounter for immunization: Secondary | ICD-10-CM

## 2022-12-03 DIAGNOSIS — M546 Pain in thoracic spine: Secondary | ICD-10-CM

## 2022-12-03 DIAGNOSIS — G8929 Other chronic pain: Secondary | ICD-10-CM

## 2022-12-03 DIAGNOSIS — I10 Essential (primary) hypertension: Secondary | ICD-10-CM

## 2022-12-03 DIAGNOSIS — M85611 Other cyst of bone, right shoulder: Secondary | ICD-10-CM

## 2022-12-03 MED ORDER — METHOCARBAMOL 500 MG PO TABS
500.0000 mg | ORAL_TABLET | Freq: Three times a day (TID) | ORAL | 2 refills | Status: DC | PRN
  Filled 2022-12-03: qty 60, 20d supply, fill #0

## 2022-12-03 MED ORDER — MELOXICAM 7.5 MG PO TABS
7.5000 mg | ORAL_TABLET | Freq: Every day | ORAL | 1 refills | Status: DC
  Filled 2022-12-03: qty 30, 30d supply, fill #0

## 2022-12-03 NOTE — Progress Notes (Unsigned)
Not under workers comp. 12/23 accident occurred.   From a injury at her previous job.  Pallet fell on patient in 0523.   Back/side needs to be examined.

## 2022-12-03 NOTE — Progress Notes (Signed)
Assessment & Plan:  Shelby Rojas was seen today for back pain and shoulder pain.  Diagnoses and all orders for this visit:  Essential hypertension -     amLODipine (NORVASC) 10 MG tablet; Take 1 tablet (10 mg total) by mouth daily. Continue all antihypertensives as prescribed.  Reminded to bring in blood pressure log for follow  up appointment.  RECOMMENDATIONS: DASH/Mediterranean Diets are healthier choices for HTN.    Chronic right-sided thoracic back pain -     methocarbamol (ROBAXIN) 500 MG tablet; Take 1 tablet (500 mg total) by mouth every 8 (eight) hours as needed for muscle spasms. For back and shoulder pain -     meloxicam (MOBIC) 7.5 MG tablet; Take 1 tablet (7.5 mg total) by mouth daily. For back pain  Cyst of clavicle, right -     Ambulatory referral to Dermatology  Need for shingles vaccine -     Varicella-zoster vaccine IM    Patient has been counseled on age-appropriate routine health concerns for screening and prevention. These are reviewed and up-to-date. Referrals have been placed accordingly. Immunizations are up-to-date or declined.    Subjective:   Chief Complaint  Patient presents with   Back Pain   Shoulder Pain   HPI Shelby Rojas 62 y.o. female presents to office today for follow up to back pain and HTN   Blood pressure is elevated today. She had not been able to pick up her amlodipine 5 mg daily. She does have a blood pressure device at home.  BP Readings from Last 3 Encounters:  12/03/22 (!) 178/93  12/02/22 128/82  11/05/22 (!) 168/95     [place] on her job last year several times. No longer employed as of January . Endorses stiffness in her neck, right shoulder, Right HIp and right upper back. 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 Pain described as sharp. No incontinence symptoms.  Patient has been advised to apply for financial assistance and schedule  to see our financial counselor.    Requesting referral to dermatology for right clavicular cyst.   Review of Systems  Constitutional:  Negative for fever, malaise/fatigue and weight loss.  HENT: Negative.  Negative for nosebleeds.   Eyes: Negative.  Negative for blurred vision, double vision and photophobia.  Respiratory: Negative.  Negative for cough and shortness of breath.   Cardiovascular: Negative.  Negative for chest pain, palpitations and leg swelling.  Gastrointestinal: Negative.  Negative for heartburn, nausea and vomiting.  Musculoskeletal:  Positive for back pain, falls and joint pain. Negative for myalgias.  Neurological: Negative.  Negative for dizziness, focal weakness, seizures and headaches.  Psychiatric/Behavioral: Negative.  Negative for suicidal ideas.     Past Medical History:  Diagnosis Date   Anxiety    Arthritis    Chest pain    Cocaine use disorder (HCC)    Diastolic dysfunction without heart failure    GERD (gastroesophageal reflux disease)    High cholesterol    Hypertension    Hypertensive emergency 02/11/2022   Obesity    Panic attacks    PNA (pneumonia)    Prediabetes    Vaginal Pap smear, abnormal     Past Surgical History:  Procedure Laterality Date   CESAREAN SECTION     CORONARY ANGIOPLASTY WITH STENT PLACEMENT      Family History  Problem Relation Age of Onset  Arthritis Mother    Prostate cancer Father    Hypertension Father    Hypertension Sister    Anemia Sister    Breast cancer Paternal Grandmother    Migraines Daughter     Social History Reviewed with no changes to be made today.   Outpatient Medications Prior to Visit  Medication Sig Dispense Refill   amLODipine (NORVASC) 10 MG tablet Take 1 tablet (10 mg total) by mouth daily. 90 tablet 0   atorvastatin (LIPITOR) 40 MG tablet TAKE 1 TABLET (40 MG TOTAL) BY MOUTH DAILY. (Patient not taking: Reported on 12/03/2022) 90 tablet 0   gabapentin (NEURONTIN) 300 MG capsule Take 1  capsule (300 mg total) by mouth 3 (three) times daily. (Patient not taking: Reported on 12/03/2022) 90 capsule 2   metroNIDAZOLE (FLAGYL) 500 MG tablet Take 1 tablet (500 mg total) by mouth 2 (two) times daily. (Patient not taking: Reported on 12/03/2022) 14 tablet 0   naproxen (NAPROSYN) 500 MG tablet Take 1 tablet (500 mg total) by mouth 2 (two) times daily with a meal. Prn pain (Patient not taking: Reported on 12/03/2022) 60 tablet 1   cloNIDine (CATAPRES) tablet 0.1 mg      No facility-administered medications prior to visit.    Allergies  Allergen Reactions   Codeine Anaphylaxis   Cyclobenzaprine Anaphylaxis, Other (See Comments) and Swelling    The patient stated she is allergic to this.  Pt  States  She  Is  Allergic  To  Muscle  relaxant, Pt  States  She  Is  Allergic  To  Muscle  relaxant       Objective:    BP (!) 178/93   Pulse 77   Ht '5\' 8"'$  (1.727 m)   Wt 186 lb 6.4 oz (84.6 kg)   SpO2 98%   BMI 28.34 kg/m  Wt Readings from Last 3 Encounters:  12/03/22 186 lb 6.4 oz (84.6 kg)  09/01/22 186 lb (84.4 kg)  06/03/22 184 lb 9.6 oz (83.7 kg)    Physical Exam Vitals and nursing note reviewed.  Constitutional:      Appearance: She is well-developed.  HENT:     Head: Normocephalic and atraumatic.  Cardiovascular:     Rate and Rhythm: Normal rate and regular rhythm.     Heart sounds: Normal heart sounds. No murmur heard.    No friction rub. No gallop.  Pulmonary:     Effort: Pulmonary effort is normal. No tachypnea or respiratory distress.     Breath sounds: Normal breath sounds. No decreased breath sounds, wheezing, rhonchi or rales.  Chest:     Chest wall: No tenderness.  Abdominal:     General: Bowel sounds are normal.     Palpations: Abdomen is soft.  Musculoskeletal:        General: Normal range of motion.     Cervical back: Normal range of motion.  Skin:    General: Skin is warm and dry.  Neurological:     Mental Status: She is alert and oriented to person,  place, and time.     Coordination: Coordination normal.  Psychiatric:        Behavior: Behavior normal. Behavior is cooperative.        Thought Content: Thought content normal.        Judgment: Judgment normal.          Patient has been counseled extensively about nutrition and exercise as well as the importance of adherence with medications and regular  follow-up. The patient was given clear instructions to go to ER or return to medical center if symptoms don't improve, worsen or new problems develop. The patient verbalized understanding.   Follow-up: Return in about 2 weeks (around 12/17/2022) for virtual visit double book 1110 or 130.  htn.   Gildardo Pounds, FNP-BC [facility], [place]   12/09/2022, 12:29 AM

## 2022-12-07 ENCOUNTER — Other Ambulatory Visit: Payer: Self-pay | Admitting: Physician Assistant

## 2022-12-07 DIAGNOSIS — E782 Mixed hyperlipidemia: Secondary | ICD-10-CM

## 2022-12-07 NOTE — Telephone Encounter (Signed)
Requested Prescriptions  Pending Prescriptions Disp Refills   atorvastatin (LIPITOR) 40 MG tablet [Pharmacy Med Name: ATORVASTATIN '40MG'$  TABLETS] 90 tablet 2    Sig: TAKE 1 TABLET(40 MG) BY MOUTH DAILY     Cardiovascular:  Antilipid - Statins Failed - 12/07/2022  9:01 AM      Failed - Lipid Panel in normal range within the last 12 months    Cholesterol, Total  Date Value Ref Range Status  09/01/2022 265 (H) 100 - 199 mg/dL Final   LDL Chol Calc (NIH)  Date Value Ref Range Status  09/01/2022 202 (H) 0 - 99 mg/dL Final   HDL  Date Value Ref Range Status  09/01/2022 44 >39 mg/dL Final   Triglycerides  Date Value Ref Range Status  09/01/2022 106 0 - 149 mg/dL Final         Passed - Patient is not pregnant      Passed - Valid encounter within last 12 months    Recent Outpatient Visits           4 days ago Essential hypertension   [place], NP   5 days ago Essential hypertension   [place], Jarome Matin, RPH-CPP   1 month ago Essential hypertension   [place], Jarome Matin, RPH-CPP   3 months ago Prediabetes   [place], [place]   11 months ago Dysfunction of left eustachian tube   [place], Dionne Bucy, [place]       Future Appointments             In 1 week Gildardo Pounds, NP [place]

## 2022-12-09 ENCOUNTER — Encounter: Payer: Self-pay | Admitting: Nurse Practitioner

## 2022-12-09 ENCOUNTER — Other Ambulatory Visit: Payer: Self-pay

## 2022-12-09 MED ORDER — AMLODIPINE BESYLATE 10 MG PO TABS
10.0000 mg | ORAL_TABLET | Freq: Every day | ORAL | 1 refills | Status: DC
  Filled 2022-12-09: qty 90, 90d supply, fill #0

## 2022-12-20 ENCOUNTER — Ambulatory Visit: Payer: 59 | Attending: Nurse Practitioner | Admitting: Nurse Practitioner

## 2023-01-31 ENCOUNTER — Telehealth: Payer: Self-pay | Admitting: Pharmacist

## 2023-01-31 NOTE — Progress Notes (Signed)
Patient attempted to be outreached by Natalie Elchert, PharmD Candidate on 01/31/2023 to discuss hypertension. Left voicemail for patient to return our call at their convenience at [phone].   Natalie Elchert, PharmD Candidate   Catie T. Taniesha Glanz, PharmD, BCACP, CPP [facility] [phone]   

## 2023-06-16 ENCOUNTER — Telehealth: Payer: Self-pay

## 2023-06-16 NOTE — Telephone Encounter (Signed)
Called patient number listed and number was invalid.   Called patient dtr Shelby Rojas) and lvm for patient to scheduled follow up appt. RE: HTN Follow Up. Preferred if schedule prior to 07/04/2023.   Per PCP last OV notes in March. Patient was to follow up in 3 weeks and that appt could be virtual if patient has home bp cuff.

## 2023-06-23 NOTE — Telephone Encounter (Signed)
Unable to reach to make an appt.

## 2023-07-01 NOTE — Telephone Encounter (Signed)
Left message on voicemail  Advise of HTN clinic being held in office tomorrow at [facility] from 0900-1200.

## 2023-07-11 ENCOUNTER — Telehealth: Payer: Self-pay | Admitting: Nurse Practitioner

## 2023-07-11 ENCOUNTER — Other Ambulatory Visit: Payer: Self-pay

## 2023-07-11 ENCOUNTER — Ambulatory Visit: Payer: 59 | Admitting: Family Medicine

## 2023-07-11 DIAGNOSIS — I10 Essential (primary) hypertension: Secondary | ICD-10-CM

## 2023-07-11 MED ORDER — AMLODIPINE BESYLATE 10 MG PO TABS
10.0000 mg | ORAL_TABLET | Freq: Every day | ORAL | 0 refills | Status: DC
Start: 2023-07-11 — End: 2023-08-04

## 2023-07-11 NOTE — Telephone Encounter (Signed)
Patient came in this morning running late for her 8:30am appt she requesting refill on high blood pressure meds and also she want the nurse to give her a call.

## 2023-07-13 NOTE — Telephone Encounter (Signed)
Return call unanswered by patient.  

## 2023-07-14 ENCOUNTER — Ambulatory Visit: Payer: 59 | Admitting: Family Medicine

## 2023-08-04 ENCOUNTER — Other Ambulatory Visit: Payer: Self-pay

## 2023-08-04 ENCOUNTER — Encounter: Payer: Self-pay | Admitting: Physician Assistant

## 2023-08-04 ENCOUNTER — Ambulatory Visit: Payer: Medicaid Other | Attending: Physician Assistant | Admitting: Physician Assistant

## 2023-08-04 VITALS — BP 160/95 | HR 78 | Wt 191.8 lb

## 2023-08-04 DIAGNOSIS — R2232 Localized swelling, mass and lump, left upper limb: Secondary | ICD-10-CM

## 2023-08-04 DIAGNOSIS — R7303 Prediabetes: Secondary | ICD-10-CM

## 2023-08-04 DIAGNOSIS — F419 Anxiety disorder, unspecified: Secondary | ICD-10-CM

## 2023-08-04 DIAGNOSIS — E782 Mixed hyperlipidemia: Secondary | ICD-10-CM

## 2023-08-04 DIAGNOSIS — I10 Essential (primary) hypertension: Secondary | ICD-10-CM

## 2023-08-04 MED ORDER — AMLODIPINE BESYLATE 10 MG PO TABS
10.0000 mg | ORAL_TABLET | Freq: Every day | ORAL | 1 refills | Status: DC
Start: 2023-08-04 — End: 2023-12-02
  Filled 2023-08-04: qty 30, 30d supply, fill #0

## 2023-08-04 MED ORDER — HYDROXYZINE HCL 25 MG PO TABS
12.5000 mg | ORAL_TABLET | ORAL | 3 refills | Status: DC
Start: 2023-08-04 — End: 2023-12-02
  Filled 2023-08-04: qty 30, 10d supply, fill #0

## 2023-08-04 MED ORDER — AMLODIPINE BESYLATE 10 MG PO TABS
10.0000 mg | ORAL_TABLET | Freq: Every day | ORAL | 1 refills | Status: DC
Start: 2023-08-04 — End: 2023-08-04

## 2023-08-04 MED ORDER — HYDROXYZINE HCL 25 MG PO TABS
ORAL_TABLET | ORAL | 3 refills | Status: DC
Start: 2023-08-04 — End: 2023-08-04

## 2023-08-04 NOTE — Patient Instructions (Addendum)
Prediabetes-work at a goal of eliminating sugary drinks, candy, desserts, sweets, refined sugars, processed foods, and white carbohydrates.  Increase water intake   Managing Anxiety, Adult After being diagnosed with anxiety, you may be relieved to know why you have felt or behaved a certain way. You may also feel overwhelmed about the treatment ahead and what it will mean for your life. With care and support, you can manage your anxiety. How to manage lifestyle changes Understanding the difference between stress and anxiety Although stress can play a role in anxiety, it is not the same as anxiety. Stress is your body's reaction to life changes and events, both good and bad. Stress is often caused by something external, such as a deadline, test, or competition. It normally goes away after the event has ended and will last just a few hours. But, stress can be ongoing and can lead to more than just stress. Anxiety is caused by something internal, such as imagining a terrible outcome or worrying that something will go wrong that will greatly upset you. Anxiety often does not go away even after the event is over, and it can become a long-term (chronic) worry. Lowering stress and anxiety Talk with your health care provider or a counselor to learn more about lowering anxiety and stress. They may suggest tension-reduction techniques, such as: Music. Spend time creating or listening to music that you enjoy and that inspires you. Mindfulness-based meditation. Practice being aware of your normal breaths while not trying to control your breathing. It can be done while sitting or walking. Centering prayer. Focus on a word, phrase, or sacred image that means something to you and brings you peace. Deep breathing. Expand your stomach and inhale slowly through your nose. Hold your breath for 3-5 seconds. Then breathe out slowly, letting your stomach muscles relax. Self-talk. Learn to notice and spot thought patterns  that lead to anxiety reactions. Change those patterns to thoughts that feel peaceful. Muscle relaxation. Take time to tense muscles and then relax them. Choose a tension-reduction technique that fits your lifestyle and personality. These techniques take time and practice. Set aside 5-15 minutes a day to do them. Specialized therapists can offer counseling and training in these techniques. The training to help with anxiety may be covered by some insurance plans. Other things you can do to manage stress and anxiety include: Keeping a stress diary. This can help you learn what triggers your reaction and then learn ways to manage your response. Thinking about how you react to certain situations. You may not be able to control everything, but you can control your response. Making time for activities that help you relax and not feeling guilty about spending your time in this way. Doing visual imagery. This involves imagining or creating mental pictures to help you relax. Practicing yoga. Through yoga poses, you can lower tension and relax.  Medicines Medicines for anxiety include: Antidepressant medicines. These are usually prescribed for long-term daily control. Anti-anxiety medicines. These may be added in severe cases, especially when panic attacks occur. When used together, medicines, psychotherapy, and tension-reduction techniques may be the most effective treatment. Relationships Relationships can play a big part in helping you recover. Spend more time connecting with trusted friends and family members. Think about going to couples counseling if you have a partner, taking family education classes, or going to family therapy. Therapy can help you and others better understand your anxiety. How to recognize changes in your anxiety Everyone responds differently to treatment for  anxiety. Recovery from anxiety happens when symptoms lessen and stop interfering with your daily life at home or work. This  may mean that you will start to: Have better concentration and focus. Worry will interfere less in your daily thinking. Sleep better. Be less irritable. Have more energy. Have improved memory. Try to recognize when your condition is getting worse. Contact your provider if your symptoms interfere with home or work and you feel like your condition is not improving. Follow these instructions at home: Activity Exercise. Adults should: Exercise for at least 150 minutes each week. The exercise should increase your heart rate and make you sweat (moderate-intensity exercise). Do strengthening exercises at least twice a week. Get the right amount and quality of sleep. Most adults need 7-9 hours of sleep each night. Lifestyle  Eat a healthy diet that includes plenty of vegetables, fruits, whole grains, low-fat dairy products, and lean protein. Do not eat a lot of foods that are high in fats, added sugars, or salt (sodium). Make choices that simplify your life. Do not use any products that contain nicotine or tobacco. These products include cigarettes, chewing tobacco, and vaping devices, such as e-cigarettes. If you need help quitting, ask your provider. Avoid caffeine, alcohol, and certain over-the-counter cold medicines. These may make you feel worse. Ask your pharmacist which medicines to avoid. General instructions Take over-the-counter and prescription medicines only as told by your provider. Keep all follow-up visits. This is to make sure you are managing your anxiety well or if you need more support. Where to find support You can get help and support from: Self-help groups. Online and [employer]. A trusted spiritual leader. Couples counseling. Family education classes. Family therapy. Where to find more information You may find that joining a support group helps you deal with your anxiety. The following sources can help you find counselors or support groups near you: Mental  Health [place]: mentalhealthamerica.net Anxiety and Depression Association of [place] (ADAA): adaa.org The [employer] American on Mental Illness (NAMI): nami.org Contact a health care provider if: You have a hard time staying focused or finishing tasks. You spend many hours a day feeling worried about everyday life. You are very tired because you cannot stop worrying. You start to have headaches or often feel tense. You have chronic nausea or diarrhea. Get help right away if: Your heart feels like it is racing. You have shortness of breath. You have thoughts of hurting yourself or others. Get help right away if you feel like you may hurt yourself or others, or have thoughts about taking your own life. Go to your nearest emergency room or: Call 911. Call the National Suicide Prevention Lifeline at 726-406-5872 or 988. This is open 24 hours a day. Text the Crisis Text Line at 307-253-6473. This information is not intended to replace advice given to you by your health care provider. Make sure you discuss any questions you have with your health care provider. Document Revised: 06/29/2022 Document Reviewed: 01/11/2021 Elsevier Patient Education  2024 [employer].

## 2023-08-04 NOTE — Progress Notes (Signed)
Patient ID: Shelby Rojas, female   DOB: [birth year], 62 y.o.   MRN: [id number]   Shelby Rojas, is a 62 y.o. female  [id number]  [id number]  DOB - Apr 15, 1961  Chief Complaint  Patient presents with   Medical Management of Chronic Issues   Cyst       Subjective:   Shelby Rojas is a 63 y.o. female here today for referral to [facility] specifically for a lump under her L arm.  Her last MMG was in February and normal.  She has not noticed a lump in either breast.  The area is tender and comes and goes over months.  No weight loss, night sweats, fevers.   She is out of amlodipine.  Not consistent with taking it.  Denies HA/Dizziness/CP.    She is also having anxiety and wants to resume hydroxyzine as needed for anxiety.  Denies SI/HI.  Does not want to be on daily meds.     08/04/2023    9:21 AM 12/03/2022   10:03 AM 09/01/2022   10:03 AM  Depression screen PHQ 2/9  Decreased Interest 0 0 3  Down, Depressed, Hopeless 1 0 2  PHQ - 2 Score 1 0 5  Altered sleeping  1 2  Tired, decreased energy  0 3  Change in appetite  0 2  Feeling bad or failure about yourself   0 0  Trouble concentrating  0 0  Moving slowly or fidgety/restless  0 3  Suicidal thoughts  0 0  PHQ-9 Score  1 15      08/04/2023    9:21 AM 12/03/2022   10:06 AM 09/01/2022   10:03 AM 12/30/2021   11:09 AM  GAD 7 : Generalized Anxiety Score  Nervous, Anxious, on Edge 0 0 0 0  Control/stop worrying 1 1 3 1   Worry too much - different things 0 0 3 1  Trouble relaxing 0 0 3 0  Restless 0 0 0 0  Easily annoyed or irritable 0 0 0 0  Afraid - awful might happen 0 0 1 0  Total GAD 7 Score 1 1 10 2       No problems updated.  ALLERGIES: Allergies  Allergen Reactions   Codeine Anaphylaxis   Cyclobenzaprine Anaphylaxis, Other (See Comments) and Swelling    The patient stated she is allergic to this.  Pt  States  She  Is  Allergic  To  Muscle  relaxant, Pt  States  She  Is  Allergic  To  Muscle   relaxant    PAST MEDICAL HISTORY: Past Medical History:  Diagnosis Date   Anxiety    Arthritis    Chest pain    Cocaine use disorder (HCC)    Diastolic dysfunction without heart failure    GERD (gastroesophageal reflux disease)    High cholesterol    Hypertension    Hypertensive emergency 02/11/2022   Obesity    Panic attacks    PNA (pneumonia)    Prediabetes    Vaginal Pap smear, abnormal     MEDICATIONS AT HOME: Prior to Admission medications   Medication Sig Start Date End Date Taking? Authorizing Provider  atorvastatin (LIPITOR) 40 MG tablet TAKE 1 TABLET(40 MG) BY MOUTH DAILY 12/07/22  Yes Claiborne Rigg, NP  hydrOXYzine (ATARAX) 25 MG tablet 1/2 to 1 tab up to 3 times daily as needed for anxiety 08/04/23  Yes Georgian Co M, PA-C  meloxicam (MOBIC) 7.5 MG tablet Take 1 tablet (7.5 mg  total) by mouth daily. For back pain 12/03/22  Yes Claiborne Rigg, NP  methocarbamol (ROBAXIN) 500 MG tablet Take 1 tablet (500 mg total) by mouth every 8 (eight) hours as needed for muscle spasms. For back and shoulder pain 12/03/22  Yes Claiborne Rigg, NP  amLODipine (NORVASC) 10 MG tablet Take 1 tablet (10 mg total) by mouth daily. 08/04/23   Onesti Bonfiglio, Marzella Schlein, PA-C    ROS: Neg HEENT Neg resp Neg cardiac Neg GI Neg GU Neg MS Neg neuro  Objective:   Vitals:   08/04/23 0939  BP: (!) 175/101  Pulse: 78  SpO2: 95%  Weight: 191 lb 12.8 oz (87 kg)   Exam General appearance : Awake, alert, not in any distress. Speech Clear. Not toxic looking HEENT: Atraumatic and Normocephalic Neck: Supple, no JVD. No cervical lymphadenopathy.  Chest: Good air entry bilaterally, CTAB.  No rales/rhonchi/wheezing CVS: S1 S2 regular, no murmurs.  L axilla without discreet mass or lump.  There may be 1 very superficial LN or lipoma <2cm noted.   Extremities: B/L Lower Ext shows no edema, both legs are warm to touch Neurology: Awake alert, and oriented X 3, CN II-XII intact, Non focal Skin: No  Rash BP recheck was 160/95 Data Review Lab Results  Component Value Date   HGBA1C 6.2 (H) 09/01/2022   HGBA1C 6.1 (H) 11/04/2021   HGBA1C 6.4 (H) 07/07/2020    Assessment & Plan   1. Essential hypertension Was out of meds.  Admits to intermittently taking meds.  Check BP OOO and record and schedule apppt in 6 weeks if not consistently <135/85 - amLODipine (NORVASC) 10 MG tablet; Take 1 tablet (10 mg total) by mouth daily.  Dispense: 90 tablet; Refill: 1 - Comprehensive metabolic panel  2. Axillary lump, left - US BREAST COMPLETE UNI LEFT INC AXILLA; Future - CBC with Differential  3. Anxiety She has taken this before and it helped.  Self-care.   - hydrOXYzine (ATARAX) 25 MG tablet; 1/2 to 1 tab up to 3 times daily as needed for anxiety  Dispense: 30 tablet; Refill: 3  4. Prediabetes Work at a goal of eliminating sugary drinks, candy, desserts, sweets, refined sugars, processed foods, and white carbohydrates.   - Hemoglobin A1c  5. Mixed hyperlipidemia - Lipid Panel    Return in about 4 months (around 12/02/2023) for PCP for chronic conditions-[name].  The patient was given clear instructions to go to ER or return to medical center if symptoms don't improve, worsen or new problems develop. The patient verbalized understanding. The patient was told to call to get lab results if they haven't heard anything in the next week.      Georgian Co, PA-C [facility] and [facility] [place], [place] [phone]   08/04/2023, 9:40 AM

## 2023-08-05 ENCOUNTER — Other Ambulatory Visit: Payer: Self-pay | Admitting: Physician Assistant

## 2023-08-05 ENCOUNTER — Other Ambulatory Visit: Payer: Self-pay

## 2023-08-05 ENCOUNTER — Telehealth: Payer: Self-pay

## 2023-08-05 DIAGNOSIS — E782 Mixed hyperlipidemia: Secondary | ICD-10-CM

## 2023-08-05 LAB — COMPREHENSIVE METABOLIC PANEL
ALT: 20 [IU]/L (ref 0–32)
AST: 18 [IU]/L (ref 0–40)
Albumin: 4.4 g/dL (ref 3.9–4.9)
Alkaline Phosphatase: 82 [IU]/L (ref 44–121)
BUN/Creatinine Ratio: 18 (ref 12–28)
BUN: 19 mg/dL (ref 8–27)
Bilirubin Total: 0.4 mg/dL (ref 0.0–1.2)
CO2: 27 mmol/L (ref 20–29)
Calcium: 10.3 mg/dL (ref 8.7–10.3)
Chloride: 103 mmol/L (ref 96–106)
Creatinine, Ser: 1.07 mg/dL — ABNORMAL HIGH (ref 0.57–1.00)
Globulin, Total: 3.1 g/dL (ref 1.5–4.5)
Glucose: 112 mg/dL — ABNORMAL HIGH (ref 70–99)
Potassium: 3.7 mmol/L (ref 3.5–5.2)
Sodium: 144 mmol/L (ref 134–144)
Total Protein: 7.5 g/dL (ref 6.0–8.5)
eGFR: 58 mL/min/{1.73_m2} — ABNORMAL LOW (ref >59)

## 2023-08-05 LAB — CBC WITH DIFFERENTIAL/PLATELET
Basophils Absolute: 0 10*3/uL (ref 0.0–0.2)
Basos: 1 %
EOS (ABSOLUTE): 0.2 10*3/uL (ref 0.0–0.4)
Eos: 2 %
Hematocrit: 45.3 % (ref 34.0–46.6)
Hemoglobin: 14.1 g/dL (ref 11.1–15.9)
Immature Grans (Abs): 0 10*3/uL (ref 0.0–0.1)
Immature Granulocytes: 0 %
Lymphocytes Absolute: 1.9 10*3/uL (ref 0.7–3.1)
Lymphs: 23 %
MCH: 28.4 pg (ref 26.6–33.0)
MCHC: 31.1 g/dL — ABNORMAL LOW (ref 31.5–35.7)
MCV: 91 fL (ref 79–97)
Monocytes Absolute: 0.6 10*3/uL (ref 0.1–0.9)
Monocytes: 7 %
Neutrophils Absolute: 5.5 10*3/uL (ref 1.4–7.0)
Neutrophils: 67 %
Platelets: 286 10*3/uL (ref 150–450)
RBC: 4.96 x10E6/uL (ref 3.77–5.28)
RDW: 12.3 % (ref 11.7–15.4)
WBC: 8.2 10*3/uL (ref 3.4–10.8)

## 2023-08-05 LAB — LIPID PANEL
Chol/HDL Ratio: 6.9 ratio — ABNORMAL HIGH (ref 0.0–4.4)
Cholesterol, Total: 261 mg/dL — ABNORMAL HIGH (ref 100–199)
HDL: 38 mg/dL — ABNORMAL LOW (ref >39)
LDL Chol Calc (NIH): 178 mg/dL — ABNORMAL HIGH (ref 0–99)
Triglycerides: 235 mg/dL — ABNORMAL HIGH (ref 0–149)
VLDL Cholesterol Cal: 45 mg/dL — ABNORMAL HIGH (ref 5–40)

## 2023-08-05 LAB — HEMOGLOBIN A1C
Est. average glucose Bld gHb Est-mCnc: 137 mg/dL
Hgb A1c MFr Bld: 6.4 % — ABNORMAL HIGH (ref 4.8–5.6)

## 2023-08-05 MED ORDER — ATORVASTATIN CALCIUM 40 MG PO TABS
40.0000 mg | ORAL_TABLET | Freq: Every day | ORAL | 2 refills | Status: DC
  Filled 2023-08-05: qty 30, 30d supply, fill #0

## 2023-08-05 NOTE — Telephone Encounter (Signed)
-----   Message from Georgian Co sent at 08/05/2023 11:30 AM EDT ----- Please call patient and tell her she definitively needs to be taking atorvastatin as her cholesterol is very high.  Increase water intake as kidneys appear dehydrated and are also affected by uncontrolled high blood pressure.  Her A1C has increased and if it goes up any more, she will be in the diabetes range.  Aggressively work at a goal of eliminating sugary drinks, candy, desserts, sweets, refined sugars, processed foods, and white carbohydrates or she will develop diabetes.  Blood count is normal.  Thanks, Georgian Co, PA-C

## 2023-08-05 NOTE — Telephone Encounter (Signed)
Pt was called and vm was left, Information has been sent to nurse pool.

## 2023-08-08 ENCOUNTER — Telehealth: Payer: Self-pay

## 2023-08-08 NOTE — Telephone Encounter (Signed)
-----   Message from Georgian Co sent at 08/05/2023 11:30 AM EDT ----- Please call patient and tell her she definitively needs to be taking atorvastatin as her cholesterol is very high.  Increase water intake as kidneys appear dehydrated and are also affected by uncontrolled high blood pressure.  Her A1C has increased and if it goes up any more, she will be in the diabetes range.  Aggressively work at a goal of eliminating sugary drinks, candy, desserts, sweets, refined sugars, processed foods, and white carbohydrates or she will develop diabetes.  Blood count is normal.  Thanks, Georgian Co, PA-C

## 2023-08-08 NOTE — Telephone Encounter (Signed)
Called patient unable to make contact or leave voicemail, information sent to the nurse pool    Letter sent

## 2023-08-12 ENCOUNTER — Telehealth: Payer: Self-pay

## 2023-08-12 ENCOUNTER — Other Ambulatory Visit: Payer: Self-pay

## 2023-08-12 NOTE — Telephone Encounter (Signed)
Pt given lab results per notes of Angela on 08/05/23. Pt verbalized understanding. Patient given the Pharmacy phone so she check to see if medication is ready for pick-up before going.   Anders Simmonds, PA-C 08/05/2023 11:30 AM EDT     Please call patient and tell her she definitively needs to be taking atorvastatin as her cholesterol is very high.  Increase water intake as kidneys appear dehydrated and are also affected by uncontrolled high blood pressure.  Her A1C has increased and if it goes up any more, she will be in the diabetes range.  Aggressively work at a goal of eliminating sugary drinks, candy, desserts, sweets, refined sugars, processed foods, and white carbohydrates or she will develop diabetes.  Blood count is normal.  Thanks, Georgian Co, PA-C

## 2023-08-17 ENCOUNTER — Other Ambulatory Visit: Payer: Self-pay

## 2023-10-06 ENCOUNTER — Encounter: Payer: Self-pay | Admitting: Nurse Practitioner

## 2023-12-02 ENCOUNTER — Encounter: Payer: Self-pay | Admitting: Nurse Practitioner

## 2023-12-02 ENCOUNTER — Other Ambulatory Visit (HOSPITAL_COMMUNITY)
Admission: RE | Admit: 2023-12-02 | Discharge: 2023-12-02 | Disposition: A | Discharge status: Home / Self Care | Source: Ambulatory Visit | Attending: Nurse Practitioner | Admitting: Nurse Practitioner

## 2023-12-02 ENCOUNTER — Ambulatory Visit: Payer: 59 | Attending: Nurse Practitioner | Admitting: Nurse Practitioner

## 2023-12-02 VITALS — BP 185/97 | HR 78 | Resp 19 | Ht 68.0 in | Wt 195.0 lb

## 2023-12-02 DIAGNOSIS — E782 Mixed hyperlipidemia: Secondary | ICD-10-CM

## 2023-12-02 DIAGNOSIS — R8290 Unspecified abnormal findings in urine: Secondary | ICD-10-CM | POA: Diagnosis not present

## 2023-12-02 DIAGNOSIS — I10 Essential (primary) hypertension: Secondary | ICD-10-CM

## 2023-12-02 DIAGNOSIS — E7800 Pure hypercholesterolemia, unspecified: Secondary | ICD-10-CM | POA: Diagnosis not present

## 2023-12-02 DIAGNOSIS — Z23 Encounter for immunization: Secondary | ICD-10-CM | POA: Diagnosis not present

## 2023-12-02 DIAGNOSIS — Z1211 Encounter for screening for malignant neoplasm of colon: Secondary | ICD-10-CM | POA: Diagnosis not present

## 2023-12-02 DIAGNOSIS — R7303 Prediabetes: Secondary | ICD-10-CM | POA: Diagnosis not present

## 2023-12-02 DIAGNOSIS — F419 Anxiety disorder, unspecified: Secondary | ICD-10-CM | POA: Diagnosis not present

## 2023-12-02 MED ORDER — VALSARTAN 80 MG PO TABS: 80.0000 mg | ORAL_TABLET | Freq: Every day | ORAL | 3 refills | Status: DC

## 2023-12-02 MED ORDER — ATORVASTATIN CALCIUM 40 MG PO TABS: 40.0000 mg | ORAL_TABLET | Freq: Every day | ORAL | 2 refills | Status: DC

## 2023-12-02 MED ORDER — HYDROXYZINE HCL 25 MG PO TABS: 12.5000 mg | ORAL_TABLET | ORAL | 3 refills | Status: DC

## 2023-12-02 MED ORDER — AMLODIPINE BESYLATE 10 MG PO TABS: 10.0000 mg | ORAL_TABLET | Freq: Every day | ORAL | 1 refills | Status: DC

## 2023-12-02 NOTE — Progress Notes (Signed)
 Assessment & Plan:  [name] was seen today for medical management of chronic issues.  Diagnoses and all orders for this visit:  Essential hypertension Add valsartan and continue amlodipine -     amLODipine (NORVASC) 10 MG tablet; Take 1 tablet (10 mg total) by mouth daily. -     valsartan (DIOVAN) 80 MG tablet; Take 1 tablet (80 mg total) by mouth daily. BLOOD PRESSURE PILL -     CMP14+EGFR  Malodorous urine -     Cervicovaginal ancillary only -     POCT URINALYSIS DIP (CLINITEK)  Influenza vaccine administered -     Flu vaccine trivalent PF, 6mos and older(Flulaval,Afluria,Fluarix,Fluzone)  Anxiety -     hydrOXYzine (ATARAX) 25 MG tablet; Take 0.5-1 tablets (12.5-25 mg total) by mouth as directed.  Mixed hyperlipidemia Resume stattin -     atorvastatin (LIPITOR) 40 MG tablet; Take 1 tablet (40 mg total) by mouth daily.  Colon cancer screening -     Ambulatory referral to Gastroenterology  Prediabetes A1c at goal currently but will need to repeat labs today -     Hemoglobin A1c Lab Results  Component Value Date   HGBA1C 6.4 (H) 08/04/2023     Hypercholesterolemia -     Lipid panel INSTRUCTIONS: Work on a low fat, heart healthy diet and participate in regular aerobic exercise program by working out at least 150 minutes per week; 5 days a week-30 minutes per day. Avoid red meat/beef/steak,  fried foods. junk foods, sodas, sugary drinks, unhealthy snacking, alcohol and smoking.  Drink at least 80 oz of water per day and monitor your carbohydrate intake daily.      Patient has been counseled on age-appropriate routine health concerns for screening and prevention. These are reviewed and up-to-date. Referrals have been placed accordingly. Immunizations are up-to-date or declined.    Subjective:   Chief Complaint  Patient presents with   Medical Management of Chronic Issues    JESELLE Rojas 63 y.o. female presents to office today for follow-up to  hypertension.   Vaginitis Notes malodorous smell in GU area but not sure if urine or vagina. States she is not sexually active.    HTN Blood pressure is elevated today.  She is currently prescribed amlodipine 10 mg daily and reports adherence. BP Readings from Last 3 Encounters:  12/02/23 (!) 185/97  08/04/23 (!) 160/95  12/03/22 (!) 178/93     She has not been taking her atorvastatin. Will need to resume today. We discussed her risk score as well.  The 10-year ASCVD risk score (Arnett DK, et al., 2019) is: 28.3%   Values used to calculate the score:     Age: 1 years     Sex: Female     Is Non-Hispanic African American: Yes     Diabetic: No     Tobacco smoker: No     Systolic Blood Pressure: 185 mmHg     Is BP treated: Yes     HDL Cholesterol: 38 mg/dL     Total Cholesterol: 261 mg/dL   Review of Systems  Constitutional:  Negative for fever, malaise/fatigue and weight loss.  HENT: Negative.  Negative for nosebleeds.   Eyes: Negative.  Negative for blurred vision, double vision and photophobia.  Respiratory: Negative.  Negative for cough and shortness of breath.   Cardiovascular: Negative.  Negative for chest pain, palpitations and leg swelling.  Gastrointestinal: Negative.  Negative for heartburn, nausea and vomiting.  Genitourinary:  SEE HPI  Musculoskeletal: Negative.  Negative for myalgias.  Neurological: Negative.  Negative for dizziness, focal weakness, seizures and headaches.  Psychiatric/Behavioral: Negative.  Negative for suicidal ideas.     Past Medical History:  Diagnosis Date   Anxiety    Arthritis    Chest pain    Cocaine use disorder (HCC)    Diastolic dysfunction without heart failure    GERD (gastroesophageal reflux disease)    High cholesterol    Hypertension    Hypertensive emergency 02/11/2022   Obesity    Panic attacks    PNA (pneumonia)    Prediabetes    Vaginal Pap smear, abnormal     Past Surgical History:  Procedure Laterality  Date   CESAREAN SECTION     CORONARY ANGIOPLASTY WITH STENT PLACEMENT      Family History  Problem Relation Age of Onset   Arthritis Mother    Prostate cancer Father    Hypertension Father    Hypertension Sister    Anemia Sister    Breast cancer Paternal Grandmother    Migraines Daughter     Social History Reviewed with no changes to be made today.   Outpatient Medications Prior to Visit  Medication Sig Dispense Refill   amLODipine (NORVASC) 10 MG tablet Take 1 tablet (10 mg total) by mouth daily. 90 tablet 1   hydrOXYzine (ATARAX) 25 MG tablet Take 0.5-1 tablets (12.5-25 mg total) by mouth up to 3 (three) times daily as needed for anxiety. 30 tablet 3   atorvastatin (LIPITOR) 40 MG tablet Take 1 tablet (40 mg total) by mouth daily. (Patient not taking: Reported on 12/02/2023) 90 tablet 2   meloxicam (MOBIC) 7.5 MG tablet Take 1 tablet (7.5 mg total) by mouth daily. For back pain (Patient not taking: Reported on 12/02/2023) 30 tablet 1   methocarbamol (ROBAXIN) 500 MG tablet Take 1 tablet (500 mg total) by mouth every 8 (eight) hours as needed for muscle spasms. For back and shoulder pain (Patient not taking: Reported on 12/02/2023) 60 tablet 2   No facility-administered medications prior to visit.    Allergies  Allergen Reactions   Codeine Anaphylaxis   Cyclobenzaprine Anaphylaxis, Other (See Comments) and Swelling    The patient stated she is allergic to this.  Pt  States  She  Is  Allergic  To  Muscle  relaxant, Pt  States  She  Is  Allergic  To  Muscle  relaxant       Objective:    BP (!) 185/97 (BP Location: Left Arm, Patient Position: Sitting, Cuff Size: Normal)   Pulse 78   Resp 19   Ht 5\' 8"  (1.727 m)   Wt 195 lb (88.5 kg)   SpO2 100%   BMI 29.65 kg/m  Wt Readings from Last 3 Encounters:  12/02/23 195 lb (88.5 kg)  08/04/23 191 lb 12.8 oz (87 kg)  12/03/22 186 lb 6.4 oz (84.6 kg)    Physical Exam Vitals and nursing note reviewed.  Constitutional:       Appearance: She is well-developed.  HENT:     Head: Normocephalic and atraumatic.  Cardiovascular:     Rate and Rhythm: Normal rate and regular rhythm.     Heart sounds: Normal heart sounds. No murmur heard.    No friction rub. No gallop.  Pulmonary:     Effort: Pulmonary effort is normal. No tachypnea or respiratory distress.     Breath sounds: Normal breath sounds. No decreased breath sounds, wheezing,  rhonchi or rales.  Chest:     Chest wall: No tenderness.  Abdominal:     General: Bowel sounds are normal.     Palpations: Abdomen is soft.  Musculoskeletal:        General: Normal range of motion.     Cervical back: Normal range of motion.  Skin:    General: Skin is warm and dry.  Neurological:     Mental Status: She is alert and oriented to person, place, and time.     Coordination: Coordination normal.  Psychiatric:        Behavior: Behavior normal. Behavior is cooperative.        Thought Content: Thought content normal.        Judgment: Judgment normal.          Patient has been counseled extensively about nutrition and exercise as well as the importance of adherence with medications and regular follow-up. The patient was given clear instructions to go to ER or return to medical center if symptoms don't improve, worsen or new problems develop. The patient verbalized understanding.   Follow-up: Return in about 3 months (around 02/29/2024).   Claiborne Rigg, FNP-BC [facility] and [facility] [place], [place] [phone]   12/02/2023, 1:43 PM

## 2023-12-02 NOTE — Progress Notes (Signed)
Urine odor

## 2023-12-03 LAB — URINALYSIS, COMPLETE
Bilirubin, UA: NEGATIVE
Glucose, UA: NEGATIVE
Ketones, UA: NEGATIVE
Nitrite, UA: NEGATIVE
Specific Gravity, UA: 1.023 (ref 1.005–1.030)
Urobilinogen, Ur: 1 mg/dL (ref 0.2–1.0)
pH, UA: 6.5 (ref 5.0–7.5)

## 2023-12-03 LAB — MICROSCOPIC EXAMINATION
Casts: NONE SEEN /LPF
Epithelial Cells (non renal): >10 /HPF — AB (ref 0–10)
WBC, UA: >30 /HPF — AB (ref 0–5)

## 2023-12-04 LAB — CERVICOVAGINAL ANCILLARY ONLY
Bacterial Vaginitis (gardnerella): POSITIVE — AB
Candida Glabrata: NEGATIVE
Candida Vaginitis: NEGATIVE
Chlamydia: NEGATIVE
Comment: NEGATIVE
Comment: NEGATIVE
Comment: NEGATIVE
Comment: NEGATIVE
Comment: NEGATIVE
Comment: NORMAL
Neisseria Gonorrhea: NEGATIVE
Trichomonas: NEGATIVE

## 2023-12-13 ENCOUNTER — Other Ambulatory Visit: Payer: Self-pay

## 2024-01-20 IMAGING — DX DG CHEST 1V PORT
1 series · 1 of 1 positions shown · non-contrast
Comparison: Chest x-ray dated July 26, 2017

CLINICAL DATA: Hypertension

EXAM:
PORTABLE CHEST 1 VIEW

[chest ap]
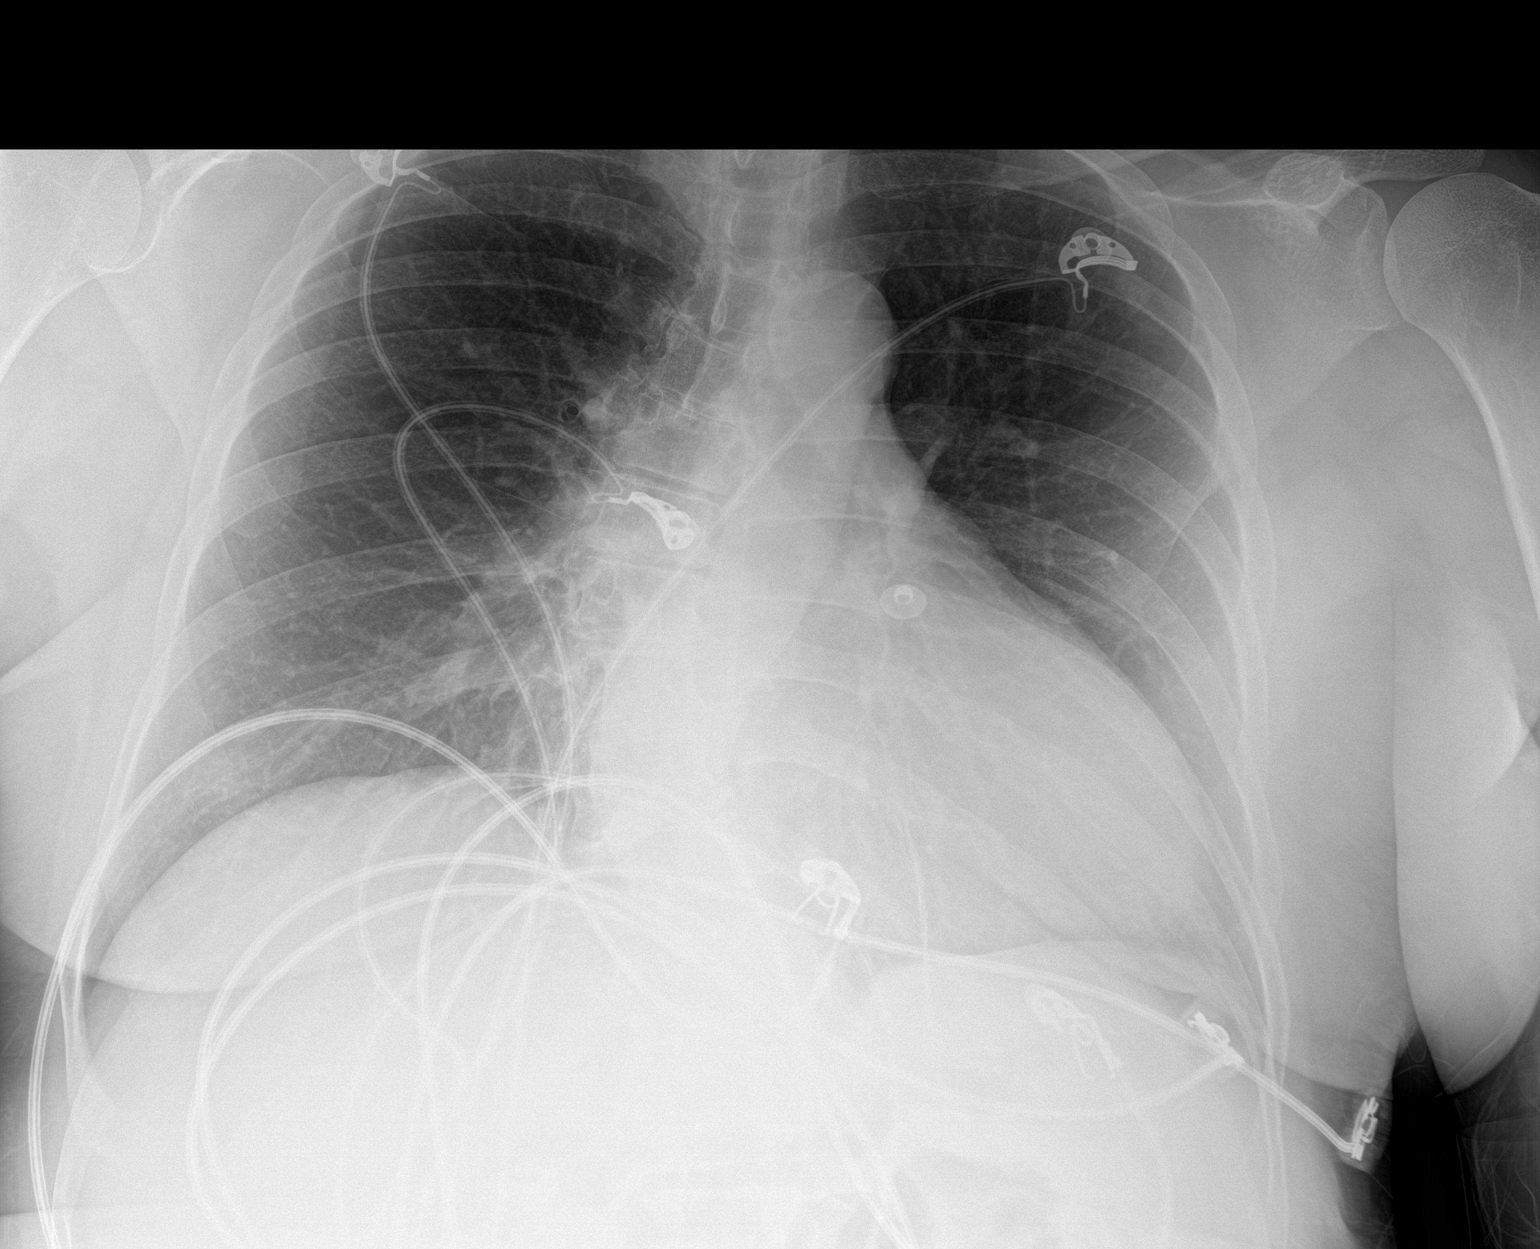

[1 of 1 positions shown; findings below may reference images not displayed]

FINDINGS: Cardiac and mediastinal contours are within normal limits for AP
technique. Both lungs are clear. Dextrocurvature of the thoracic
spine.
IMPRESSION: No active disease.

## 2024-01-20 IMAGING — CT CT HEAD W/O CM
4 series · 16 of 47 positions shown, 18 images · non-contrast
Comparison: Head CT dated November 14, 2013

CLINICAL DATA: Headache



[Series 3: head without · axial · non-contrast · 0.50mm/px · z∈[-167,-47]mm · 7 of 34 slices shown, 9 images]
[im 5/34  brain]
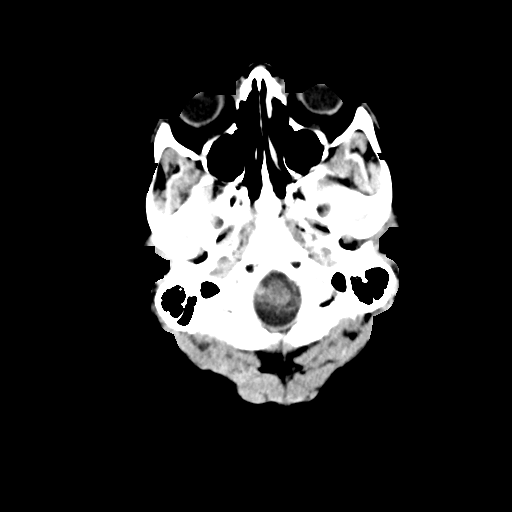
[im 5/34  bone]
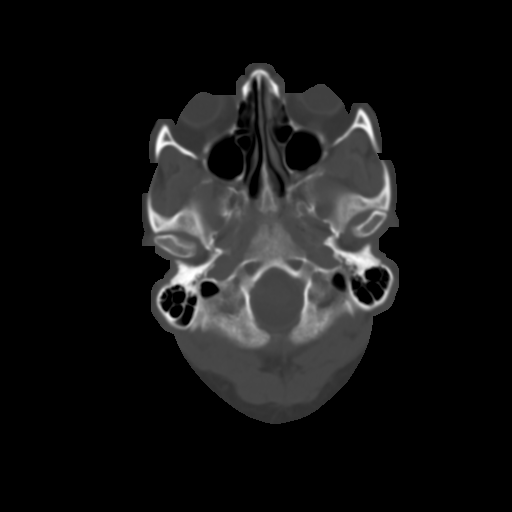
[im 9/34  brain]
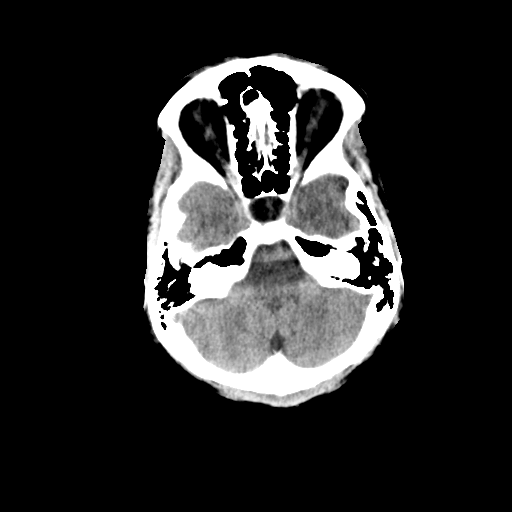
[im 13/34  brain]
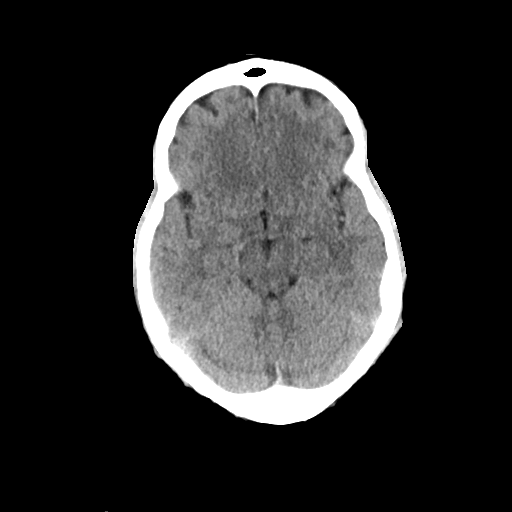
[im 17/34  brain]
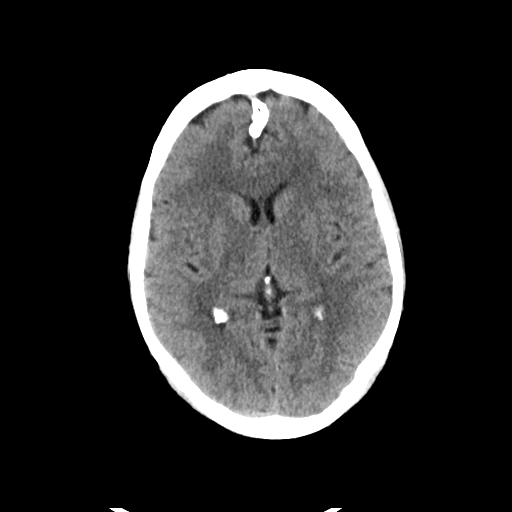
[im 21/34  brain]
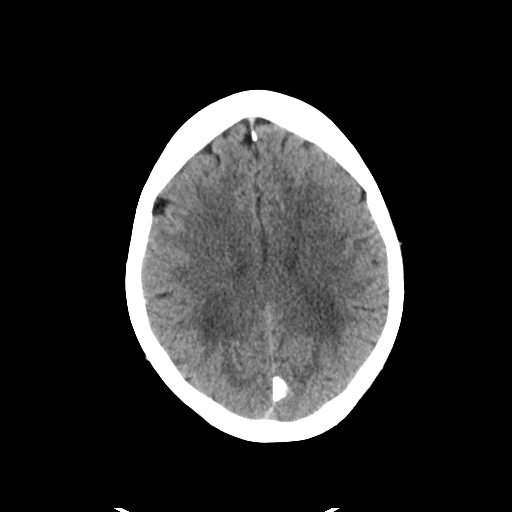
[im 21/34  bone]
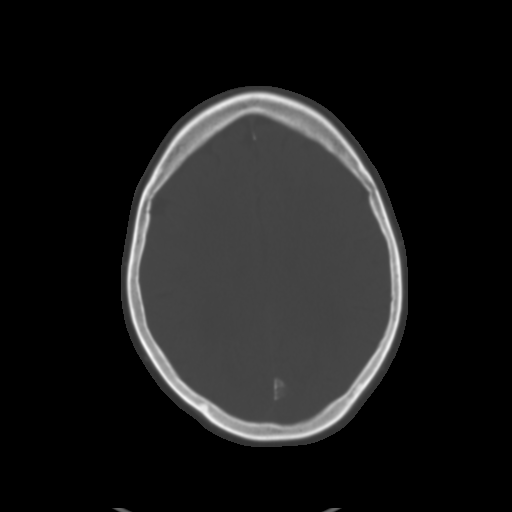
[im 25/34  brain]
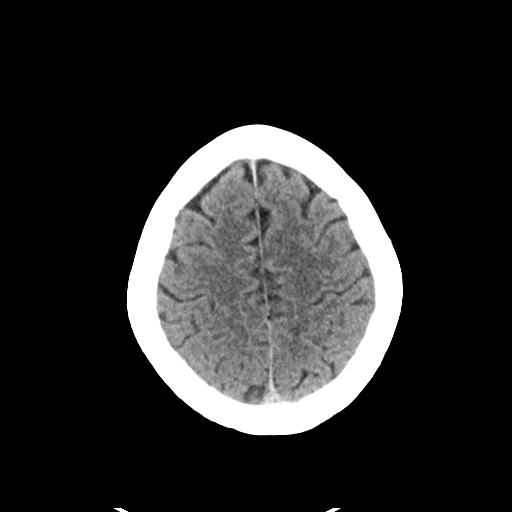
[im 29/34  brain]
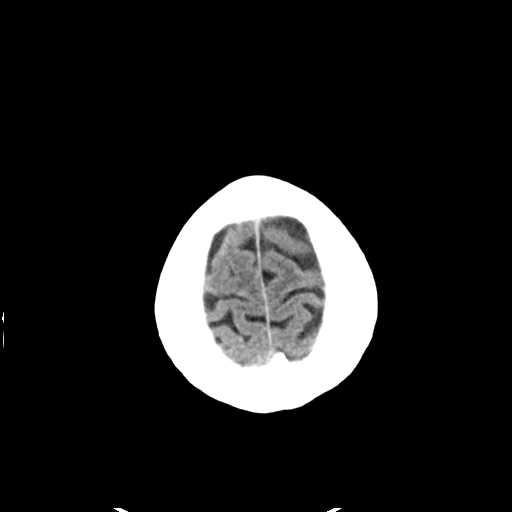

[Series 4: head bone · axial · 0.50mm/px · z∈[-171,-139]mm · 3 of 84 slices shown]
[im 9/84  bone]
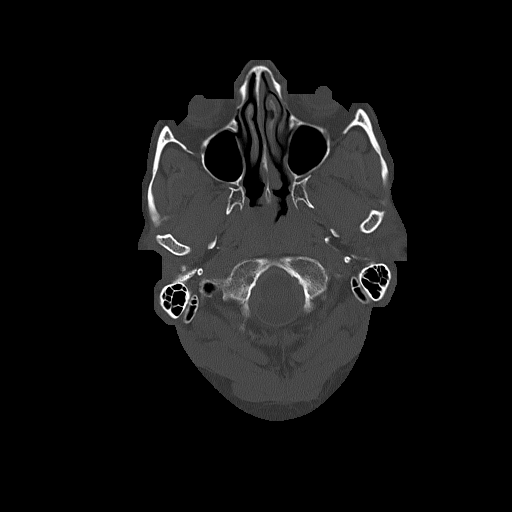
[im 17/84  bone]
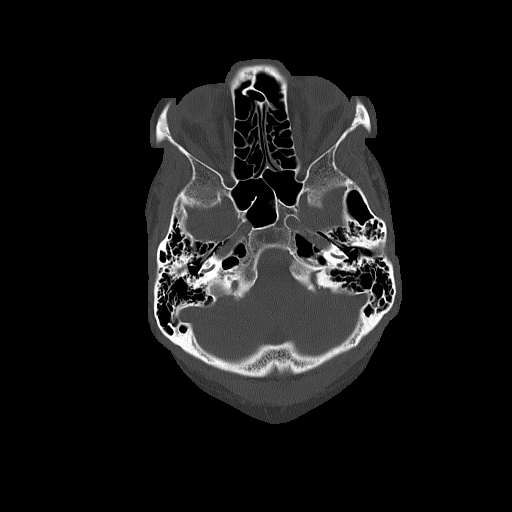
[im 25/84  bone]
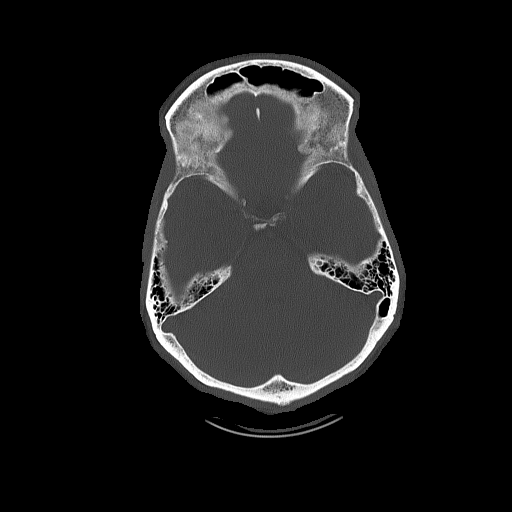

[Series 5: head without cor · coronal · non-contrast · 0.33mm/px · 3 of 70 slices shown]
[im 24/70  brain]
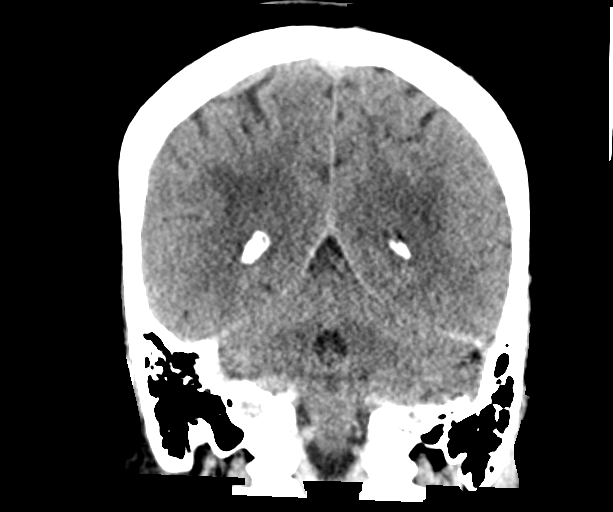
[im 31/70  brain]
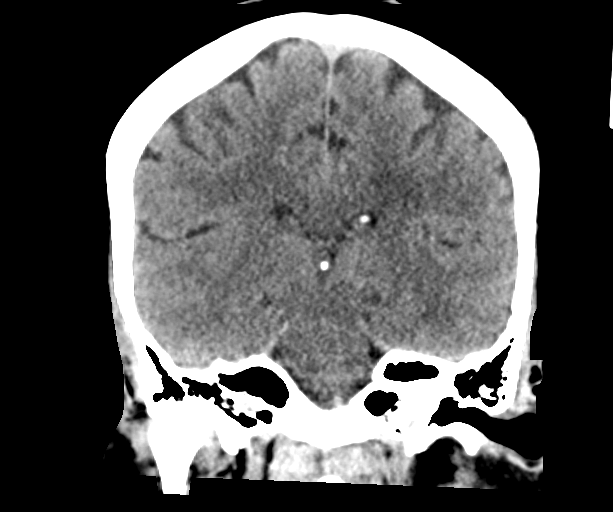
[im 39/70  brain]
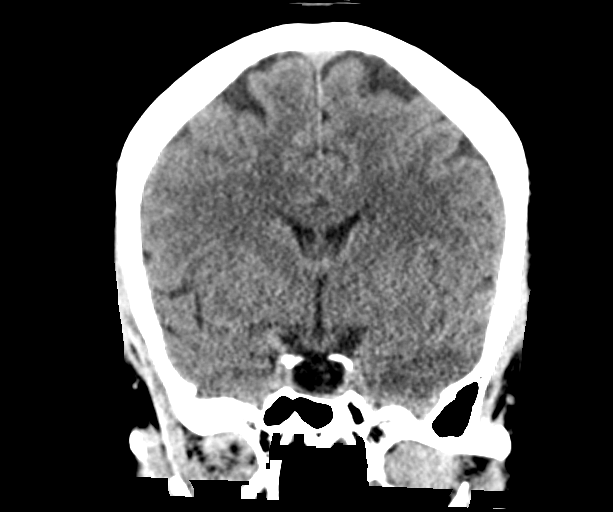

[Series 6: head without sag · sagittal · non-contrast · 0.35mm/px · 3 of 67 slices shown]
[im 23/67  brain]
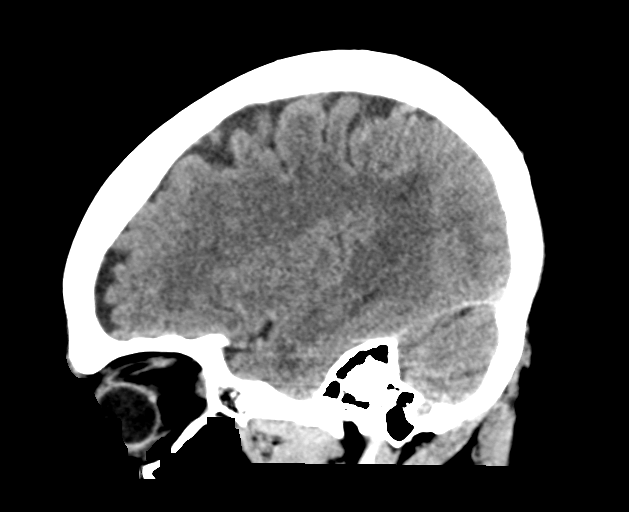
[im 34/67  brain]
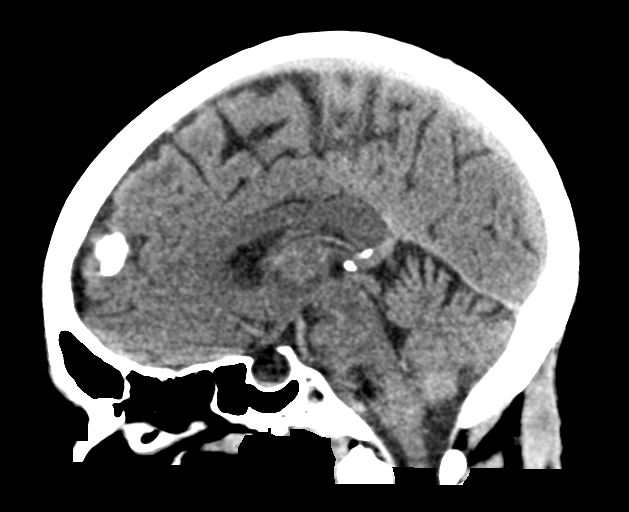
[im 45/67  brain]
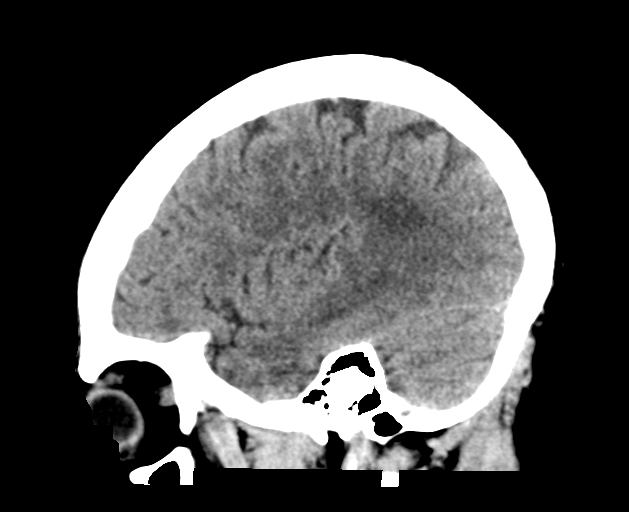

[16 of 47 positions shown; findings below may reference images not displayed]

FINDINGS: Brain: Chronic white matter ischemic change. Unchanged
calcifications of the anterior and posterior falx. No evidence of
acute infarction, hemorrhage, hydrocephalus, extra-axial collection
or mass lesion/mass effect.

Vascular: No hyperdense vessel or unexpected calcification.

Skull: Normal. Negative for fracture or focal lesion.

Sinuses/Orbits: No acute finding.

Other: None.
IMPRESSION: No acute intracranial abnormality.

## 2024-02-29 ENCOUNTER — Encounter: Payer: Self-pay | Admitting: Nurse Practitioner

## 2024-02-29 ENCOUNTER — Ambulatory Visit: Payer: 59 | Attending: Nurse Practitioner | Admitting: Nurse Practitioner

## 2024-02-29 VITALS — BP 175/85 | HR 74 | Resp 19 | Ht 68.0 in | Wt 197.0 lb

## 2024-02-29 DIAGNOSIS — E7800 Pure hypercholesterolemia, unspecified: Secondary | ICD-10-CM | POA: Diagnosis not present

## 2024-02-29 DIAGNOSIS — R7303 Prediabetes: Secondary | ICD-10-CM

## 2024-02-29 DIAGNOSIS — I10 Essential (primary) hypertension: Secondary | ICD-10-CM | POA: Diagnosis not present

## 2024-02-29 DIAGNOSIS — Z23 Encounter for immunization: Secondary | ICD-10-CM

## 2024-02-29 MED ORDER — VALSARTAN 160 MG PO TABS: 160.0000 mg | ORAL_TABLET | Freq: Every day | ORAL | 3 refills | Status: DC

## 2024-02-29 NOTE — Progress Notes (Signed)
 Assessment & Plan:  Shelby Rojas was seen today for hypertension.  Diagnoses and all orders for this visit:  Primary hypertension Increase valsartan  from 80 mg to 160 mg while continuing amlodipine  -     CMP14+EGFR -     valsartan  (DIOVAN ) 160 MG tablet; Take 1 tablet (160 mg total) by mouth daily. Continue all antihypertensives as prescribed.  Reminded to bring in blood pressure log for follow  up appointment.  RECOMMENDATIONS: DASH/Mediterranean Diets are healthier choices for HTN.    Need for shingles vaccine -     Varicella-zoster vaccine IM  Hypercholesterolemia -     Lipid panel INSTRUCTIONS: Work on a low fat, heart healthy diet and participate in regular aerobic exercise program by working out at least 150 minutes per week; 5 days a week-30 minutes per day. Avoid red meat/beef/steak,  fried foods. junk foods, sodas, sugary drinks, unhealthy snacking, alcohol and smoking.  Drink at least 80 oz of water per day and monitor your carbohydrate intake daily.    Prediabetes -     CMP14+EGFR -     Hemoglobin A1c Continue blood sugar control as discussed in office today, low carbohydrate diet, and regular physical exercise as tolerated, 150 minutes per week (30 min each day, 5 days per week, or 50 min 3 days per week). Keep blood sugar logs with fasting goal of 90-130 mg/dl, post prandial (after you eat) less than 180.  For Hypoglycemia: BS <60 and Hyperglycemia BS >400; contact the clinic ASAP. Annual eye exams and foot exams are recommended.      Patient has been counseled on age-appropriate routine health concerns for screening and prevention. These are reviewed and up-to-date. Referrals have been placed accordingly. Immunizations are up-to-date or declined.    Subjective:   Chief Complaint  Patient presents with   Hypertension    Shelby Rojas 63 y.o. female presents to office today for follow up to HTN  She has a past medical history of Anxiety, Arthritis, Chest pain,  Cocaine use disorder (HCC), Diastolic dysfunction without heart failure, GERD, High cholesterol, Hypertension, Hypertensive emergency (02/11/2022), Obesity, Panic attacks, PNA, Prediabetes, and Vaginal Pap smear, abnormal.    She took herself out of work almost 1 year ago and is asking if it is okay for her to return to work with her current blood pressure being elevated. 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   HTN At her last visit with me on 12-02-2023 we added valsartan  80 mg and she was instructed to continue amlodipine  10mg  daily. Today her blood pressure remains elevated.   BP Readings from Last 3 Encounters:  02/29/24 (!) 175/85  12/02/23 (!) 185/97  08/04/23 (!) 160/95      HPI  Review of Systems  Constitutional:  Negative for fever, malaise/fatigue and weight loss.  HENT: Negative.  Negative for nosebleeds.   Eyes: Negative.  Negative for blurred vision, double vision and photophobia.  Respiratory: Negative.  Negative for cough and shortness of breath.   Cardiovascular: Negative.  Negative for chest pain, palpitations and leg swelling.  Gastrointestinal: Negative.  Negative for heartburn, nausea and vomiting.  Musculoskeletal: Negative.  Negative for myalgias.  Neurological: Negative.  Negative for dizziness, focal weakness, seizures and headaches.  Psychiatric/Behavioral: Negative.  Negative for suicidal ideas.     Past Medical History:  Diagnosis Date   Anxiety    Arthritis    Chest pain    Cocaine use disorder (HCC)    Diastolic dysfunction without  heart failure    GERD (gastroesophageal reflux disease)    High cholesterol    Hypertension    Hypertensive emergency 02/11/2022   Obesity    Panic attacks    PNA (pneumonia)    Prediabetes    Vaginal Pap smear, abnormal     Past Surgical History:  Procedure Laterality Date   CESAREAN SECTION     CORONARY ANGIOPLASTY WITH STENT PLACEMENT      Family History  Problem Relation Age of Onset    Arthritis Mother    Prostate cancer Father    Hypertension Father    Hypertension Sister    Anemia Sister    Breast cancer Paternal Grandmother    Migraines Daughter     Social History Reviewed with no changes to be made today.   Outpatient Medications Prior to Visit  Medication Sig Dispense Refill   amLODipine  (NORVASC ) 10 MG tablet Take 1 tablet (10 mg total) by mouth daily. 90 tablet 1   atorvastatin  (LIPITOR) 40 MG tablet Take 1 tablet (40 mg total) by mouth daily. 90 tablet 2   hydrOXYzine  (ATARAX ) 25 MG tablet Take 0.5-1 tablets (12.5-25 mg total) by mouth as directed. 60 tablet 3   valsartan  (DIOVAN ) 80 MG tablet Take 1 tablet (80 mg total) by mouth daily. BLOOD PRESSURE PILL 90 tablet 3   No facility-administered medications prior to visit.    Allergies  Allergen Reactions   Codeine  Anaphylaxis   Cyclobenzaprine Anaphylaxis, Other (See Comments) and Swelling    The patient stated she is allergic to this.  Pt  States  She  Is  Allergic  To  Muscle  relaxant, Pt  States  She  Is  Allergic  To  Muscle  relaxant       Objective:    BP (!) 175/85 (BP Location: Left Arm, Patient Position: Sitting, Cuff Size: Normal)   Pulse 74   Resp 19   Ht 5\' 8"  (1.727 m)   Wt 197 lb (89.4 kg)   SpO2 97%   BMI 29.95 kg/m  Wt Readings from Last 3 Encounters:  02/29/24 197 lb (89.4 kg)  12/02/23 195 lb (88.5 kg)  08/04/23 191 lb 12.8 oz (87 kg)    Physical Exam Vitals and nursing note reviewed.  Constitutional:      Appearance: She is well-developed.  HENT:     Head: Normocephalic and atraumatic.  Cardiovascular:     Rate and Rhythm: Normal rate and regular rhythm.     Heart sounds: Normal heart sounds. No murmur heard.    No friction rub. No gallop.  Pulmonary:     Effort: Pulmonary effort is normal. No tachypnea or respiratory distress.     Breath sounds: Normal breath sounds. No decreased breath sounds, wheezing, rhonchi or rales.  Chest:     Chest wall: No  tenderness.  Abdominal:     General: Bowel sounds are normal.     Palpations: Abdomen is soft.  Musculoskeletal:        General: Normal range of motion.     Cervical back: Normal range of motion.  Skin:    General: Skin is warm and dry.  Neurological:     Mental Status: She is alert and oriented to person, place, and time.     Coordination: Coordination normal.  Psychiatric:        Behavior: Behavior normal. Behavior is cooperative.        Thought Content: Thought content normal.  Judgment: Judgment normal.          Patient has been counseled extensively about nutrition and exercise as well as the importance of adherence with medications and regular follow-up. The patient was given clear instructions to go to ER or return to medical center if symptoms don't improve, worsen or new problems develop. The patient verbalized understanding.   Follow-up: Return in about 4 weeks (around 03/28/2024) for BP recheck with nurse in 4 weeks. See me in 4 months.   Collins Dean, FNP-BC [facility] and [facility] [place], [place] [phone]   02/29/2024, 12:36 PM

## 2024-02-29 NOTE — Patient Instructions (Addendum)
 [facility] [address] [place], [place] [address] ?PH# [phone] ?

## 2024-03-01 LAB — LIPID PANEL
Chol/HDL Ratio: 7.1 ratio — ABNORMAL HIGH (ref 0.0–4.4)
Cholesterol, Total: 268 mg/dL — ABNORMAL HIGH (ref 100–199)
HDL: 38 mg/dL — ABNORMAL LOW (ref >39)
LDL Chol Calc (NIH): 184 mg/dL — ABNORMAL HIGH (ref 0–99)
Triglycerides: 240 mg/dL — ABNORMAL HIGH (ref 0–149)
VLDL Cholesterol Cal: 46 mg/dL — ABNORMAL HIGH (ref 5–40)

## 2024-03-01 LAB — CMP14+EGFR
ALT: 24 IU/L (ref 0–32)
AST: 19 IU/L (ref 0–40)
Albumin: 4.6 g/dL (ref 3.9–4.9)
Alkaline Phosphatase: 94 IU/L (ref 44–121)
BUN/Creatinine Ratio: 14 (ref 12–28)
BUN: 16 mg/dL (ref 8–27)
Bilirubin Total: 0.3 mg/dL (ref 0.0–1.2)
CO2: 23 mmol/L (ref 20–29)
Calcium: 10.4 mg/dL — ABNORMAL HIGH (ref 8.7–10.3)
Chloride: 104 mmol/L (ref 96–106)
Creatinine, Ser: 1.12 mg/dL — ABNORMAL HIGH (ref 0.57–1.00)
Globulin, Total: 2.7 g/dL (ref 1.5–4.5)
Glucose: 107 mg/dL — ABNORMAL HIGH (ref 70–99)
Potassium: 4 mmol/L (ref 3.5–5.2)
Sodium: 145 mmol/L — ABNORMAL HIGH (ref 134–144)
Total Protein: 7.3 g/dL (ref 6.0–8.5)
eGFR: 55 mL/min/{1.73_m2} — ABNORMAL LOW (ref >59)

## 2024-03-01 LAB — HEMOGLOBIN A1C
Est. average glucose Bld gHb Est-mCnc: 137 mg/dL
Hgb A1c MFr Bld: 6.4 % — ABNORMAL HIGH (ref 4.8–5.6)

## 2024-03-04 ENCOUNTER — Other Ambulatory Visit: Payer: Self-pay | Admitting: Nurse Practitioner

## 2024-03-04 ENCOUNTER — Ambulatory Visit: Payer: Self-pay | Admitting: Nurse Practitioner

## 2024-03-04 DIAGNOSIS — E782 Mixed hyperlipidemia: Secondary | ICD-10-CM

## 2024-03-04 MED ORDER — ATORVASTATIN CALCIUM 80 MG PO TABS: 80.0000 mg | ORAL_TABLET | Freq: Every day | ORAL | 1 refills | Status: DC

## 2024-03-28 ENCOUNTER — Ambulatory Visit: Attending: Nurse Practitioner

## 2024-03-28 NOTE — Progress Notes (Signed)
   Blood Pressure Recheck Visit  Name: Shelby Rojas MRN: [id number] Date of Birth: Mar 12, 1961  INDIE BOEHNE presents today for Blood Pressure recheck with clinical support staff.  Order for BP recheck by Calton Daring, Rn ordered on 03/28/2024.   BP Readings from Last 3 Encounters:  03/28/24 (!) 188/118  02/29/24 (!) 175/85  12/02/23 (!) 185/97    Current Outpatient Medications  Medication Sig Dispense Refill   amLODipine  (NORVASC ) 10 MG tablet Take 1 tablet (10 mg total) by mouth daily. 90 tablet 1   atorvastatin  (LIPITOR) 80 MG tablet Take 1 tablet (80 mg total) by mouth daily. 90 tablet 1   hydrOXYzine  (ATARAX ) 25 MG tablet Take 0.5-1 tablets (12.5-25 mg total) by mouth as directed. 60 tablet 3   valsartan  (DIOVAN ) 160 MG tablet Take 1 tablet (160 mg total) by mouth daily. 90 tablet 3   No current facility-administered medications for this visit.    Hypertensive Medication Review: Patient states that they are not taking all their hypertensive medications as prescribed and their last dose of hypertensive medications was Patient taking Norvasc  10 mg daily  as ordered   Documentation of any medication adherence discrepancies: Patient has not starting taking Valsartan  160 mg daily as instructed by PCP on last office visit 02/29/2024.  Provider Recommendation:  Spoke to Z. Theotis, NP  and they stated: Please start Valsartan  160 mg daily as instructed in last OV  Patient has been scheduled to follow up with PCP .Advised to contact the office if BP continues to be elevated after starting Valsartan .   Patient has been given provider's recommendations and does not have any questions or concerns at this time. Patient will contact the office for any future questions or concerns.

## 2024-07-02 ENCOUNTER — Encounter: Payer: Self-pay | Admitting: Nurse Practitioner

## 2024-07-02 ENCOUNTER — Ambulatory Visit: Attending: Nurse Practitioner | Admitting: Nurse Practitioner

## 2024-07-02 VITALS — BP 177/100 | HR 80 | Resp 19 | Ht 68.0 in | Wt 196.0 lb

## 2024-07-02 DIAGNOSIS — I10 Essential (primary) hypertension: Secondary | ICD-10-CM

## 2024-07-02 DIAGNOSIS — E782 Mixed hyperlipidemia: Secondary | ICD-10-CM | POA: Diagnosis not present

## 2024-07-02 DIAGNOSIS — R7303 Prediabetes: Secondary | ICD-10-CM

## 2024-07-02 DIAGNOSIS — M779 Enthesopathy, unspecified: Secondary | ICD-10-CM | POA: Diagnosis not present

## 2024-07-02 MED ORDER — DICLOFENAC SODIUM 1 % EX GEL: 4.0000 g | Freq: Four times a day (QID) | CUTANEOUS | 6 refills | Status: AC

## 2024-07-02 MED ORDER — AMLODIPINE BESYLATE 10 MG PO TABS: 10.0000 mg | ORAL_TABLET | Freq: Every day | ORAL | 1 refills | Status: DC

## 2024-07-02 MED ORDER — VALSARTAN 160 MG PO TABS: 160.0000 mg | ORAL_TABLET | Freq: Every day | ORAL | 3 refills | Status: AC

## 2024-07-02 MED ORDER — ATORVASTATIN CALCIUM 80 MG PO TABS: 80.0000 mg | ORAL_TABLET | Freq: Every day | ORAL | 1 refills | Status: AC

## 2024-07-02 NOTE — Patient Instructions (Addendum)
 [facility]  [address] [place], [place] [address] PH# [phone]    [facility]  [address] [place],  [place]  [address] Main: [phone]

## 2024-07-02 NOTE — Progress Notes (Signed)
 Knot on right side of elbow.

## 2024-07-02 NOTE — Progress Notes (Signed)
 Assessment & Plan:  Shelby Rojas was seen today for hypertension.  Diagnoses and all orders for this visit:  Primary hypertension -     valsartan  (DIOVAN ) 160 MG tablet; Take 1 tablet (160 mg total) by mouth daily. -     amLODipine  (NORVASC ) 10 MG tablet; Take 1 tablet (10 mg total) by mouth daily. -     CMP14+EGFR Blood pressure management is suboptimal due to non-adherence, increasing stroke risk. - Emphasized daily antihypertensive medication adherence.   Mixed hyperlipidemia -     atorvastatin  (LIPITOR) 80 MG tablet; Take 1 tablet (80 mg total) by mouth daily. -     Lipid panel  Prediabetes -     Hemoglobin A1c  Tendinitis -     diclofenac Sodium (VOLTAREN) 1 % GEL; Apply 4 g topically 4 (four) times daily. Right arm tendinitis Tenderness and pain likely due to tendinitis, no serious pathology. - Prescribed Voltaren gel for inflammation. - Advised against lifting.  General Health Maintenance Routine health maintenance discussed. Mammogram due in January. Colonoscopy appointment canceled. - Order routine mammogram in January. - Advise to reschedule colonoscopy with [facility]  Patient has been counseled on age-appropriate routine health concerns for screening and prevention. These are reviewed and up-to-date. Referrals have been placed accordingly. Immunizations are up-to-date or declined.    Subjective:   Chief Complaint  Patient presents with   Hypertension   History of Present Illness Shelby Rojas is a 63 year old female with hypertension who presents for medication management and with concerns of right forearm swelling.    She has a history of hypertension, prediabetes, hyperlipidemia,    HTN Blood pressure is persistently elevated. I am not able to adjust her antihypertensives as she is not consistently adherent to her medication regimen of amlodipine  and valsartan .  BP Readings from Last 3 Encounters:  07/02/24 (!) 177/100  03/28/24 (!) 188/118   02/29/24 (!) 175/85     She reports a new 'knot' on her arm, present for two weeks, which is tender and located on the forearm. The onset of pain is associated with a recent dental procedure where two teeth were extracted, resulting in significant bleeding. She is currently taking antibiotics.  She recently underwent dental extractions, having six teeth pulled and receiving a full set of dentures on the same day. She experiences soreness when eating, attributed to the dental work completed approximately five days ago.  She missed a colonoscopy appointment due to caregiving responsibilities for her mother, who resides in a rest home, as she had no one to watch her family during the procedure.   Lab Results  Component Value Date   HGBA1C 6.6 (H) 07/02/2024    Lab Results  Component Value Date   HGBA1C 6.4 (H) 02/29/2024     BP Readings from Last 3 Encounters:  07/02/24 (!) 177/100  03/28/24 (!) 188/118  02/29/24 (!) 175/85     Review of Systems  Constitutional:  Negative for fever, malaise/fatigue and weight loss.  HENT: Negative.  Negative for nosebleeds.   Eyes: Negative.  Negative for blurred vision, double vision and photophobia.  Respiratory: Negative.  Negative for cough and shortness of breath.   Cardiovascular: Negative.  Negative for chest pain, palpitations and leg swelling.  Gastrointestinal: Negative.  Negative for heartburn, nausea and vomiting.  Musculoskeletal:  Positive for joint pain and myalgias.  Neurological: Negative.  Negative for dizziness, focal weakness, seizures and headaches.  Psychiatric/Behavioral: Negative.  Negative for suicidal ideas.  Past Medical History:  Diagnosis Date   Anxiety    Arthritis    Chest pain    Cocaine use disorder (HCC)    Diastolic dysfunction without heart failure    GERD (gastroesophageal reflux disease)    High cholesterol    Hypertension    Hypertensive emergency 02/11/2022   Obesity    Panic attacks    PNA  (pneumonia)    Prediabetes    Vaginal Pap smear, abnormal     Past Surgical History:  Procedure Laterality Date   CESAREAN SECTION     CORONARY ANGIOPLASTY WITH STENT PLACEMENT      Family History  Problem Relation Age of Onset   Arthritis Mother    Prostate cancer Father    Hypertension Father    Hypertension Sister    Anemia Sister    Breast cancer Paternal Grandmother    Migraines Daughter     Social History Reviewed with no changes to be made today.   Outpatient Medications Prior to Visit  Medication Sig Dispense Refill   amoxicillin (AMOXIL) 875 MG tablet Take 875 mg by mouth 2 (two) times daily.     amLODipine  (NORVASC ) 10 MG tablet Take 1 tablet (10 mg total) by mouth daily. 90 tablet 1   valsartan  (DIOVAN ) 160 MG tablet Take 1 tablet (160 mg total) by mouth daily. 90 tablet 3   atorvastatin  (LIPITOR) 80 MG tablet Take 1 tablet (80 mg total) by mouth daily. (Patient not taking: Reported on 07/02/2024) 90 tablet 1   hydrOXYzine  (ATARAX ) 25 MG tablet Take 0.5-1 tablets (12.5-25 mg total) by mouth as directed. (Patient not taking: Reported on 07/02/2024) 60 tablet 3   No facility-administered medications prior to visit.    Allergies  Allergen Reactions   Codeine  Anaphylaxis   Cyclobenzaprine Anaphylaxis, Other (See Comments) and Swelling    The patient stated she is allergic to this.  Pt  States  She  Is  Allergic  To  Muscle  relaxant, Pt  States  She  Is  Allergic  To  Muscle  relaxant       Objective:    BP (!) 177/100 (BP Location: Left Arm, Patient Position: Sitting, Cuff Size: Normal)   Pulse 80   Resp 19   Ht 5' 8 (1.727 m)   Wt 196 lb (88.9 kg)   SpO2 100%   BMI 29.80 kg/m  Wt Readings from Last 3 Encounters:  07/02/24 196 lb (88.9 kg)  02/29/24 197 lb (89.4 kg)  12/02/23 195 lb (88.5 kg)    Physical Exam Vitals and nursing note reviewed.  Constitutional:      Appearance: She is well-developed.  HENT:     Head: Normocephalic and atraumatic.   Cardiovascular:     Rate and Rhythm: Normal rate and regular rhythm.     Heart sounds: Normal heart sounds. No murmur heard.    No friction rub. No gallop.  Pulmonary:     Effort: Pulmonary effort is normal. No tachypnea or respiratory distress.     Breath sounds: Normal breath sounds. No decreased breath sounds, wheezing, rhonchi or rales.  Chest:     Chest wall: No tenderness.  Musculoskeletal:        General: Normal range of motion.     Right forearm: Tenderness present.     Cervical back: Normal range of motion.  Skin:    General: Skin is warm and dry.  Neurological:     Mental Status: She is alert and  oriented to person, place, and time.     Coordination: Coordination normal.  Psychiatric:        Behavior: Behavior normal. Behavior is cooperative.        Thought Content: Thought content normal.        Judgment: Judgment normal.          Patient has been counseled extensively about nutrition and exercise as well as the importance of adherence with medications and regular follow-up. The patient was given clear instructions to go to ER or return to medical center if symptoms don't improve, worsen or new problems develop. The patient verbalized understanding.   Follow-up: Return in about 2 months (around 09/01/2024) for HTN.   Haze LELON Servant, FNP-BC [facility] [place], [place] [phone]   07/22/2024, 10:06 PM

## 2024-07-03 ENCOUNTER — Telehealth: Payer: Self-pay | Admitting: Nurse Practitioner

## 2024-07-03 ENCOUNTER — Encounter: Payer: Self-pay | Admitting: Nurse Practitioner

## 2024-07-03 LAB — CMP14+EGFR
ALT: 16 IU/L (ref 0–32)
AST: 18 IU/L (ref 0–40)
Albumin: 4.5 g/dL (ref 3.9–4.9)
Alkaline Phosphatase: 82 IU/L (ref 49–135)
BUN/Creatinine Ratio: 13 (ref 12–28)
BUN: 14 mg/dL (ref 8–27)
Bilirubin Total: 0.3 mg/dL (ref 0.0–1.2)
CO2: 25 mmol/L (ref 20–29)
Calcium: 10.5 mg/dL — ABNORMAL HIGH (ref 8.7–10.3)
Chloride: 103 mmol/L (ref 96–106)
Creatinine, Ser: 1.08 mg/dL — ABNORMAL HIGH (ref 0.57–1.00)
Globulin, Total: 2.6 g/dL (ref 1.5–4.5)
Glucose: 95 mg/dL (ref 70–99)
Potassium: 3.7 mmol/L (ref 3.5–5.2)
Sodium: 140 mmol/L (ref 134–144)
Total Protein: 7.1 g/dL (ref 6.0–8.5)
eGFR: 57 mL/min/1.73 — ABNORMAL LOW (ref >59)

## 2024-07-03 LAB — LIPID PANEL
Chol/HDL Ratio: 6.3 ratio — ABNORMAL HIGH (ref 0.0–4.4)
Cholesterol, Total: 238 mg/dL — ABNORMAL HIGH (ref 100–199)
HDL: 38 mg/dL — ABNORMAL LOW (ref >39)
LDL Chol Calc (NIH): 171 mg/dL — ABNORMAL HIGH (ref 0–99)
Triglycerides: 158 mg/dL — ABNORMAL HIGH (ref 0–149)
VLDL Cholesterol Cal: 29 mg/dL (ref 5–40)

## 2024-07-03 LAB — HEMOGLOBIN A1C
Est. average glucose Bld gHb Est-mCnc: 143 mg/dL
Hgb A1c MFr Bld: 6.6 % — ABNORMAL HIGH (ref 4.8–5.6)

## 2024-07-03 NOTE — Telephone Encounter (Signed)
 Copied from CRM [id number]. Topic: General - Other >> Jul 03, 2024 10:52 AM Treva T wrote:  Reason for CRM: Received call from patient, states she was seen in office on 07/03/24.  Patient is requesting a note to provide to grandchild's school, as she had to bring grandchild to appointment with her, due to grandchild not having transportation.   Patient states she can be reached for follow up, or if need to speak further, at [phone]  Patient is requesting note be emailed to: [email or web address]

## 2024-07-03 NOTE — Telephone Encounter (Addendum)
 The patient requested a work excuse note. I asked if she could pick up the letter or access it through Mychart, but she stated that she is unable to access Mychart. The work note has been Warden/ranger to [employer] address she provided.   [email or web address]

## 2024-07-04 ENCOUNTER — Ambulatory Visit: Payer: Self-pay | Admitting: Nurse Practitioner

## 2024-09-07 ENCOUNTER — Other Ambulatory Visit: Payer: Self-pay

## 2024-09-07 ENCOUNTER — Encounter (HOSPITAL_BASED_OUTPATIENT_CLINIC_OR_DEPARTMENT_OTHER): Payer: Self-pay | Admitting: Emergency Medicine

## 2024-09-07 ENCOUNTER — Emergency Department (HOSPITAL_BASED_OUTPATIENT_CLINIC_OR_DEPARTMENT_OTHER)
Admission: EM | Admit: 2024-09-07 | Discharge: 2024-09-07 | Disposition: A | Discharge status: Home / Self Care | Attending: Emergency Medicine | Admitting: Emergency Medicine

## 2024-09-07 ENCOUNTER — Emergency Department (HOSPITAL_BASED_OUTPATIENT_CLINIC_OR_DEPARTMENT_OTHER)

## 2024-09-07 DIAGNOSIS — R930 Abnormal findings on diagnostic imaging of skull and head, not elsewhere classified: Secondary | ICD-10-CM | POA: Diagnosis not present

## 2024-09-07 DIAGNOSIS — G43009 Migraine without aura, not intractable, without status migrainosus: Secondary | ICD-10-CM | POA: Diagnosis not present

## 2024-09-07 DIAGNOSIS — G43001 Migraine without aura, not intractable, with status migrainosus: Secondary | ICD-10-CM | POA: Insufficient documentation

## 2024-09-07 DIAGNOSIS — R519 Headache, unspecified: Secondary | ICD-10-CM | POA: Diagnosis not present

## 2024-09-07 DIAGNOSIS — I6782 Cerebral ischemia: Secondary | ICD-10-CM | POA: Diagnosis not present

## 2024-09-07 DIAGNOSIS — I10 Essential (primary) hypertension: Secondary | ICD-10-CM | POA: Diagnosis not present

## 2024-09-07 DIAGNOSIS — Z91148 Patient's other noncompliance with medication regimen for other reason: Secondary | ICD-10-CM | POA: Insufficient documentation

## 2024-09-07 DIAGNOSIS — Z79899 Other long term (current) drug therapy: Secondary | ICD-10-CM | POA: Insufficient documentation

## 2024-09-07 LAB — CBC WITH DIFFERENTIAL/PLATELET
Abs Immature Granulocytes: 0.01 K/uL (ref 0.00–0.07)
Basophils Absolute: 0 K/uL (ref 0.0–0.1)
Basophils Relative: 1 %
Eosinophils Absolute: 0.2 K/uL (ref 0.0–0.5)
Eosinophils Relative: 2 %
HCT: 42.2 % (ref 36.0–46.0)
Hemoglobin: 13.5 g/dL (ref 12.0–15.0)
Immature Granulocytes: 0 %
Lymphocytes Relative: 25 %
Lymphs Abs: 2.1 K/uL (ref 0.7–4.0)
MCH: 27.9 pg (ref 26.0–34.0)
MCHC: 32 g/dL (ref 30.0–36.0)
MCV: 87.2 fL (ref 80.0–100.0)
Monocytes Absolute: 0.7 K/uL (ref 0.1–1.0)
Monocytes Relative: 8 %
Neutro Abs: 5.3 K/uL (ref 1.7–7.7)
Neutrophils Relative %: 64 %
Platelets: 262 K/uL (ref 150–400)
RBC: 4.84 MIL/uL (ref 3.87–5.11)
RDW: 12.9 % (ref 11.5–15.5)
WBC: 8.2 K/uL (ref 4.0–10.5)
nRBC: 0 % (ref 0.0–0.2)

## 2024-09-07 LAB — BASIC METABOLIC PANEL WITH GFR
Anion gap: 11 (ref 5–15)
BUN: 15 mg/dL (ref 8–23)
CO2: 25 mmol/L (ref 22–32)
Calcium: 10.4 mg/dL — ABNORMAL HIGH (ref 8.9–10.3)
Chloride: 104 mmol/L (ref 98–111)
Creatinine, Ser: 1.09 mg/dL — ABNORMAL HIGH (ref 0.44–1.00)
GFR, Estimated: 56 mL/min — ABNORMAL LOW (ref >60)
Glucose, Bld: 106 mg/dL — ABNORMAL HIGH (ref 70–99)
Potassium: 3.6 mmol/L (ref 3.5–5.1)
Sodium: 141 mmol/L (ref 135–145)

## 2024-09-07 MED ORDER — AMLODIPINE BESYLATE 10 MG PO TABS: 10.0000 mg | ORAL_TABLET | Freq: Every day | ORAL | 1 refills | Status: AC

## 2024-09-07 MED ORDER — METOCLOPRAMIDE HCL 5 MG/ML IJ SOLN: 10.0000 mg | Freq: Once | INTRAMUSCULAR | Status: DC

## 2024-09-07 MED ORDER — LABETALOL HCL 5 MG/ML IV SOLN
20.0000 mg | Freq: Once | INTRAVENOUS | Status: AC
  Administered 2024-09-07: 20 mg via INTRAVENOUS
  Filled 2024-09-07: qty 4

## 2024-09-07 MED ORDER — DIPHENHYDRAMINE HCL 50 MG/ML IJ SOLN: 25.0000 mg | Freq: Once | INTRAMUSCULAR | Status: DC

## 2024-09-07 MED ORDER — ACETAMINOPHEN 500 MG PO TABS
1000.0000 mg | ORAL_TABLET | Freq: Once | ORAL | Status: AC
  Administered 2024-09-07: 1000 mg via ORAL
  Filled 2024-09-07: qty 2

## 2024-09-07 NOTE — ED Provider Notes (Signed)
 [place] [facility] AT [facility] Provider Note   CSN: [id number] Arrival date & time: 09/07/24  8182     Patient presents with: Migraine   Shelby Rojas is a 63 y.o. female.   Patient presents with gradual onset headache and pressure diffuse/posterior since yesterday.  Patient manage blood pressure meds 2 weeks ago and blood pressures been running high greater than 220.  Patient normally takes amlodipine .  Financial 1 aspect of not taking medicines with compliance.  Patient denies any focal weakness or neurodeficits.  No fevers or chills.  No acute vision loss.  Patient is not on blood thinner meds.  Patient has history of migraine.  The history is provided by the patient.  Migraine Associated symptoms include headaches. Pertinent negatives include no chest pain, no abdominal pain and no shortness of breath.       Prior to Admission medications   Medication Sig Start Date End Date Taking? Authorizing Provider  amLODipine  (NORVASC ) 10 MG tablet Take 1 tablet (10 mg total) by mouth daily. 09/07/24   Asaiah Hunnicutt, MD  amoxicillin (AMOXIL) 875 MG tablet Take 875 mg by mouth 2 (two) times daily. 06/26/24   [provider]  atorvastatin  (LIPITOR) 80 MG tablet Take 1 tablet (80 mg total) by mouth daily. 07/02/24   Fleming, [name] W, NP  diclofenac  Sodium (VOLTAREN ) 1 % GEL Apply 4 g topically 4 (four) times daily. 07/02/24   Theotis Haze ORN, NP  valsartan  (DIOVAN ) 160 MG tablet Take 1 tablet (160 mg total) by mouth daily. 07/02/24   Theotis Haze ORN, NP    Allergies: Codeine  and Cyclobenzaprine    Review of Systems  Constitutional:  Negative for chills and fever.  HENT:  Negative for congestion.   Eyes:  Negative for visual disturbance.  Respiratory:  Negative for shortness of breath.   Cardiovascular:  Negative for chest pain.  Gastrointestinal:  Negative for abdominal pain and vomiting.  Genitourinary:  Negative for dysuria and flank pain.   Musculoskeletal:  Negative for back pain, neck pain and neck stiffness.  Skin:  Negative for rash.  Neurological:  Positive for light-headedness and headaches. Negative for weakness.    Updated Vital Signs BP (!) 176/98   Pulse 75   Temp 97.8 F (36.6 C)   Resp 17   SpO2 99%   Physical Exam Vitals and nursing note reviewed.  Constitutional:      General: She is not in acute distress.    Appearance: She is well-developed.  HENT:     Head: Normocephalic and atraumatic.     Mouth/Throat:     Mouth: Mucous membranes are moist.  Eyes:     General:        Right eye: No discharge.        Left eye: No discharge.     Conjunctiva/sclera: Conjunctivae normal.  Neck:     Trachea: No tracheal deviation.  Cardiovascular:     Rate and Rhythm: Normal rate and regular rhythm.  Pulmonary:     Effort: Pulmonary effort is normal.     Breath sounds: Normal breath sounds.  Abdominal:     General: There is no distension.     Palpations: Abdomen is soft.     Tenderness: There is no abdominal tenderness. There is no guarding.  Musculoskeletal:     Cervical back: Normal range of motion and neck supple. No rigidity.  Skin:    General: Skin is warm.     Capillary Refill: Capillary  refill takes less than 2 seconds.     Findings: No rash.  Neurological:     General: No focal deficit present.     Mental Status: She is alert.     Cranial Nerves: No cranial nerve deficit.     Sensory: No sensory deficit.     Motor: No weakness.     Coordination: Coordination normal.  Psychiatric:        Mood and Affect: Mood normal.     (all labs ordered are listed, but only abnormal results are displayed) Labs Reviewed  BASIC METABOLIC PANEL WITH GFR - Abnormal; Notable for the following components:      Result Value   Glucose, Bld 106 (*)    Creatinine, Ser 1.09 (*)    Calcium  10.4 (*)    GFR, Estimated 56 (*)    All other components within normal limits  CBC WITH DIFFERENTIAL/PLATELET     EKG: EKG Interpretation Date/Time:  Friday September 07 2024 18:30:41 EST Ventricular Rate:  76 PR Interval:  152 QRS Duration:  88 QT Interval:  422 QTC Calculation: 474 R Axis:   42  Text Interpretation: Normal sinus rhythm Normal ECG When compared with ECG of 12-Feb-2022 11:55, Nonspecific T wave abnormality no longer evident in Anterior leads Confirmed by Tonia Chew [phone]) on 09/07/2024 6:32:30 PM  Radiology: CT Head Wo Contrast Result Date: 09/07/2024 CLINICAL DATA:  HA, HTN EXAM: CT HEAD WITHOUT CONTRAST TECHNIQUE: Contiguous axial images were obtained from the base of the skull through the vertex without intravenous contrast. RADIATION DOSE REDUCTION: This exam was performed according to the departmental dose-optimization program which includes automated exposure control, adjustment of the mA and/or kV according to patient size and/or use of iterative reconstruction technique. COMPARISON:  Head CT 02/11/2022 FINDINGS: Brain: No intracranial hemorrhage. Stable calcifications associated with the anterior and posterior falx, no associated edema by CT. Is of doubtful clinical significance. No hydrocephalus. The basilar cisterns are patent. Mild periventricular chronic small vessel ischemia. No evidence of territorial infarct or acute ischemia. Enlarged partially empty sella, chronic. No extra-axial or intracranial fluid collection. Vascular: No hyperdense vessel or unexpected calcification. Skull: No fracture or focal lesion. Sinuses/Orbits: Paranasal sinuses and mastoid air cells are clear. The visualized orbits are unremarkable. Other: None. IMPRESSION: 1. No acute intracranial abnormality. 2. Mild chronic small vessel ischemia. Electronically Signed   By: Andrea Gasman M.D.   On: 09/07/2024 19:22     Procedures   Medications Ordered in the ED  acetaminophen  (TYLENOL ) tablet 1,000 mg (has no administration in time range)  labetalol  (NORMODYNE ) injection 20 mg (20 mg Intravenous  Given 09/07/24 1959)                                    Medical Decision Making Amount and/or Complexity of Data Reviewed Labs: ordered. Radiology: ordered.  Risk OTC drugs. Prescription drug management.   Patient presents with gradual onset headache and significant elevated blood pressure 220 likely combination of chronic hypertension and noncompliance.  Patient given labetalol  blood pressure improved to 160s.  Discussed risks of lowering it too much and importance of close outpatient follow-up and gradually improving blood pressure management.  Patient improved significantly headache mostly resolved and blood pressure numbers improved in the ED.  Blood work independent reviewed kidney function 1.09, electrolytes white blood cell unremarkable.  EKG sinus rhythm.  Patient not having any stroke symptoms or acute chest or  heart failure symptoms.  Patient stable for close outpatient follow-up prescription sent to pharmacy.     Final diagnoses:  Non compliance w medication regimen  Hypertension, unspecified type  Migraine without aura and with status migrainosus, not intractable    ED Discharge Orders          Ordered    amLODipine  (NORVASC ) 10 MG tablet  Daily        09/07/24 2123               Tonia Chew, MD 09/07/24 2126

## 2024-09-07 NOTE — ED Triage Notes (Signed)
 Migraine started yesterday BP high Ran out of BP meds last dose 2 weeks ago Takes amlodipine 

## 2024-09-07 NOTE — Discharge Instructions (Signed)
-   Please take your blood pressure medications as previously prescribed.  Amlodipine  prescription resent to your pharmacy.  Follow-up with your primary doctor.  Return for stroke symptoms, chest pain, shortness of breath or new concerns.
